# Patient Record
Sex: Female | Born: 1970 | Race: White | Hispanic: No | Marital: Married | Smoking: Former smoker
Health system: Southern US, Community
[De-identification: ages and names within clinical notes are randomized; demographics above are authoritative.]

## PROBLEM LIST (undated history)

## (undated) DIAGNOSIS — F32A Depression, unspecified: Secondary | ICD-10-CM

## (undated) DIAGNOSIS — E785 Hyperlipidemia, unspecified: Secondary | ICD-10-CM

## (undated) DIAGNOSIS — K5792 Diverticulitis of intestine, part unspecified, without perforation or abscess without bleeding: Secondary | ICD-10-CM

## (undated) DIAGNOSIS — I10 Essential (primary) hypertension: Secondary | ICD-10-CM

## (undated) DIAGNOSIS — E039 Hypothyroidism, unspecified: Secondary | ICD-10-CM

## (undated) DIAGNOSIS — E079 Disorder of thyroid, unspecified: Secondary | ICD-10-CM

## (undated) DIAGNOSIS — R87619 Unspecified abnormal cytological findings in specimens from cervix uteri: Secondary | ICD-10-CM

## (undated) DIAGNOSIS — N189 Chronic kidney disease, unspecified: Secondary | ICD-10-CM

## (undated) DIAGNOSIS — F329 Major depressive disorder, single episode, unspecified: Secondary | ICD-10-CM

## (undated) DIAGNOSIS — G932 Benign intracranial hypertension: Secondary | ICD-10-CM

## (undated) DIAGNOSIS — F419 Anxiety disorder, unspecified: Secondary | ICD-10-CM

## (undated) HISTORY — DX: Major depressive disorder, single episode, unspecified: F32.9

## (undated) HISTORY — DX: Hypothyroidism, unspecified: E03.9

## (undated) HISTORY — DX: Unspecified abnormal cytological findings in specimens from cervix uteri: R87.619

## (undated) HISTORY — DX: Hyperlipidemia, unspecified: E78.5

## (undated) HISTORY — PX: MASTOIDECTOMY: SHX711

## (undated) HISTORY — DX: Essential (primary) hypertension: I10

## (undated) HISTORY — DX: Depression, unspecified: F32.A

## (undated) HISTORY — PX: CERVIX SURGERY: SHX593

## (undated) HISTORY — DX: Diverticulitis of intestine, part unspecified, without perforation or abscess without bleeding: K57.92

## (undated) HISTORY — DX: Chronic kidney disease, unspecified: N18.9

## (undated) HISTORY — DX: Anxiety disorder, unspecified: F41.9

## (undated) HISTORY — DX: Benign intracranial hypertension: G93.2

---
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
## 1996-08-26 HISTORY — PX: TUBAL LIGATION: SHX77

## 1999-06-06 ENCOUNTER — Ambulatory Visit (HOSPITAL_COMMUNITY): Admission: RE | Admit: 1999-06-06 | Discharge: 1999-06-06 | Payer: Self-pay | Admitting: Psychiatry

## 2000-09-30 ENCOUNTER — Emergency Department (HOSPITAL_COMMUNITY): Admission: EM | Admit: 2000-09-30 | Discharge: 2000-09-30 | Payer: Self-pay | Admitting: Emergency Medicine

## 2000-09-30 ENCOUNTER — Encounter: Payer: Self-pay | Admitting: Emergency Medicine

## 2000-10-09 ENCOUNTER — Ambulatory Visit (HOSPITAL_COMMUNITY): Admission: RE | Admit: 2000-10-09 | Discharge: 2000-10-09 | Payer: Self-pay | Admitting: Neurology

## 2003-10-28 ENCOUNTER — Other Ambulatory Visit: Admission: RE | Admit: 2003-10-28 | Discharge: 2003-10-28 | Payer: Self-pay | Admitting: Obstetrics and Gynecology

## 2003-11-16 ENCOUNTER — Ambulatory Visit (HOSPITAL_COMMUNITY): Admission: RE | Admit: 2003-11-16 | Discharge: 2003-11-16 | Payer: Self-pay | Admitting: Neurology

## 2003-11-27 ENCOUNTER — Emergency Department (HOSPITAL_COMMUNITY): Admission: AD | Admit: 2003-11-27 | Discharge: 2003-11-27 | Payer: Self-pay | Admitting: Family Medicine

## 2006-02-16 ENCOUNTER — Emergency Department (HOSPITAL_COMMUNITY): Admission: EM | Admit: 2006-02-16 | Discharge: 2006-02-16 | Payer: Self-pay | Admitting: Family Medicine

## 2006-03-19 ENCOUNTER — Emergency Department (HOSPITAL_COMMUNITY): Admission: EM | Admit: 2006-03-19 | Discharge: 2006-03-19 | Payer: Self-pay | Admitting: Family Medicine

## 2006-11-16 ENCOUNTER — Emergency Department (HOSPITAL_COMMUNITY): Admission: EM | Admit: 2006-11-16 | Discharge: 2006-11-16 | Payer: Self-pay | Admitting: Emergency Medicine

## 2006-12-01 ENCOUNTER — Emergency Department (HOSPITAL_COMMUNITY): Admission: EM | Admit: 2006-12-01 | Discharge: 2006-12-01 | Payer: Self-pay | Admitting: Emergency Medicine

## 2007-02-19 ENCOUNTER — Emergency Department (HOSPITAL_COMMUNITY): Admission: EM | Admit: 2007-02-19 | Discharge: 2007-02-19 | Payer: Self-pay | Admitting: Emergency Medicine

## 2007-04-25 ENCOUNTER — Emergency Department (HOSPITAL_COMMUNITY): Admission: EM | Admit: 2007-04-25 | Discharge: 2007-04-25 | Payer: Self-pay | Admitting: Emergency Medicine

## 2007-12-03 ENCOUNTER — Ambulatory Visit (HOSPITAL_COMMUNITY): Admission: RE | Admit: 2007-12-03 | Discharge: 2007-12-03 | Payer: Self-pay | Admitting: Neurology

## 2008-02-26 ENCOUNTER — Emergency Department (HOSPITAL_BASED_OUTPATIENT_CLINIC_OR_DEPARTMENT_OTHER): Admission: EM | Admit: 2008-02-26 | Discharge: 2008-02-27 | Payer: Self-pay | Admitting: Emergency Medicine

## 2009-04-30 ENCOUNTER — Observation Stay (HOSPITAL_COMMUNITY): Admission: EM | Admit: 2009-04-30 | Discharge: 2009-05-03 | Payer: Self-pay | Admitting: Emergency Medicine

## 2009-05-02 ENCOUNTER — Encounter (INDEPENDENT_AMBULATORY_CARE_PROVIDER_SITE_OTHER): Payer: Self-pay | Admitting: *Deleted

## 2009-09-02 ENCOUNTER — Emergency Department (HOSPITAL_COMMUNITY): Admission: EM | Admit: 2009-09-02 | Discharge: 2009-09-02 | Payer: Self-pay | Admitting: Family Medicine

## 2010-07-25 ENCOUNTER — Encounter (INDEPENDENT_AMBULATORY_CARE_PROVIDER_SITE_OTHER): Payer: Self-pay | Admitting: Otolaryngology

## 2010-07-25 ENCOUNTER — Ambulatory Visit (HOSPITAL_COMMUNITY)
Admission: RE | Admit: 2010-07-25 | Discharge: 2010-07-26 | Payer: Self-pay | Source: Home / Self Care | Admitting: Otolaryngology

## 2010-11-05 LAB — GLUCOSE, CAPILLARY
Glucose-Capillary: 179 mg/dL — ABNORMAL HIGH (ref 70–99)
Glucose-Capillary: 191 mg/dL — ABNORMAL HIGH (ref 70–99)

## 2010-11-06 LAB — URINALYSIS, ROUTINE W REFLEX MICROSCOPIC
Bilirubin Urine: NEGATIVE
Glucose, UA: >1000 mg/dL — AB
Ketones, ur: NEGATIVE mg/dL
Leukocytes, UA: NEGATIVE
Nitrite: NEGATIVE
Protein, ur: NEGATIVE mg/dL
Specific Gravity, Urine: 1.02 (ref 1.005–1.030)
Urobilinogen, UA: 0.2 mg/dL (ref 0.0–1.0)
pH: 5.5 (ref 5.0–8.0)

## 2010-11-06 LAB — CBC
HCT: 38.4 % (ref 36.0–46.0)
Hemoglobin: 13 g/dL (ref 12.0–15.0)
MCH: 29.5 pg (ref 26.0–34.0)
MCHC: 33.9 g/dL (ref 30.0–36.0)
MCV: 87.1 fL (ref 78.0–100.0)
Platelets: 221 10*3/uL (ref 150–400)
RBC: 4.41 MIL/uL (ref 3.87–5.11)
RDW: 13 % (ref 11.5–15.5)
WBC: 4.1 K/uL (ref 4.0–10.5)

## 2010-11-06 LAB — GLUCOSE, CAPILLARY
Glucose-Capillary: 179 mg/dL — ABNORMAL HIGH (ref 70–99)
Glucose-Capillary: 211 mg/dL — ABNORMAL HIGH (ref 70–99)
Glucose-Capillary: 232 mg/dL — ABNORMAL HIGH (ref 70–99)
Glucose-Capillary: 249 mg/dL — ABNORMAL HIGH (ref 70–99)
Glucose-Capillary: 257 mg/dL — ABNORMAL HIGH (ref 70–99)
Glucose-Capillary: 295 mg/dL — ABNORMAL HIGH (ref 70–99)

## 2010-11-06 LAB — BASIC METABOLIC PANEL
BUN: 7 mg/dL (ref 6–23)
CO2: 24 mEq/L (ref 19–32)
Calcium: 8.5 mg/dL (ref 8.4–10.5)
Chloride: 106 mEq/L (ref 96–112)
Creatinine, Ser: 0.61 mg/dL (ref 0.4–1.2)
GFR calc Af Amer: >60 mL/min (ref >60)
Glucose, Bld: 298 mg/dL — ABNORMAL HIGH (ref 70–99)
Potassium: 4.2 mEq/L (ref 3.5–5.1)

## 2010-11-06 LAB — URINE MICROSCOPIC-ADD ON

## 2010-11-06 LAB — SURGICAL PCR SCREEN
MRSA, PCR: NEGATIVE
Staphylococcus aureus: NEGATIVE

## 2010-11-06 LAB — BASIC METABOLIC PANEL WITH GFR
GFR calc non Af Amer: >60 mL/min (ref >60)
Sodium: 135 meq/L (ref 135–145)

## 2010-11-30 LAB — GLUCOSE, CAPILLARY
Glucose-Capillary: 152 mg/dL — ABNORMAL HIGH (ref 70–99)
Glucose-Capillary: 158 mg/dL — ABNORMAL HIGH (ref 70–99)
Glucose-Capillary: 163 mg/dL — ABNORMAL HIGH (ref 70–99)
Glucose-Capillary: 167 mg/dL — ABNORMAL HIGH (ref 70–99)
Glucose-Capillary: 181 mg/dL — ABNORMAL HIGH (ref 70–99)
Glucose-Capillary: 191 mg/dL — ABNORMAL HIGH (ref 70–99)
Glucose-Capillary: 198 mg/dL — ABNORMAL HIGH (ref 70–99)
Glucose-Capillary: 207 mg/dL — ABNORMAL HIGH (ref 70–99)
Glucose-Capillary: 251 mg/dL — ABNORMAL HIGH (ref 70–99)

## 2010-11-30 LAB — URINE MICROSCOPIC-ADD ON

## 2010-11-30 LAB — BASIC METABOLIC PANEL
BUN: 9 mg/dL (ref 6–23)
CO2: 26 mEq/L (ref 19–32)
Calcium: 9.2 mg/dL (ref 8.4–10.5)
Creatinine, Ser: 0.83 mg/dL (ref 0.4–1.2)
GFR calc Af Amer: >60 mL/min (ref >60)
GFR calc Af Amer: >60 mL/min (ref >60)
GFR calc non Af Amer: >60 mL/min (ref >60)
GFR calc non Af Amer: >60 mL/min (ref >60)
GFR calc non Af Amer: >60 mL/min (ref >60)
Glucose, Bld: 196 mg/dL — ABNORMAL HIGH (ref 70–99)
Glucose, Bld: 273 mg/dL — ABNORMAL HIGH (ref 70–99)
Potassium: 3.7 mEq/L (ref 3.5–5.1)
Potassium: 4.1 mEq/L (ref 3.5–5.1)
Sodium: 136 mEq/L (ref 135–145)
Sodium: 140 mEq/L (ref 135–145)

## 2010-11-30 LAB — LIPID PANEL
Cholesterol: 245 mg/dL — ABNORMAL HIGH (ref 0–200)
LDL Cholesterol: UNDETERMINED mg/dL (ref 0–99)

## 2010-11-30 LAB — URINE CULTURE: Colony Count: 45000

## 2010-11-30 LAB — POCT CARDIAC MARKERS
CKMB, poc: <1 ng/mL — ABNORMAL LOW (ref 1.0–8.0)
Myoglobin, poc: 60 ng/mL (ref 12–200)

## 2010-11-30 LAB — URINALYSIS, ROUTINE W REFLEX MICROSCOPIC
Bilirubin Urine: NEGATIVE
Glucose, UA: >1000 mg/dL — AB
Ketones, ur: NEGATIVE mg/dL
Specific Gravity, Urine: 1.031 — ABNORMAL HIGH (ref 1.005–1.030)
pH: 5.5 (ref 5.0–8.0)

## 2010-11-30 LAB — CK TOTAL AND CKMB (NOT AT ARMC): Relative Index: INVALID (ref 0.0–2.5)

## 2010-11-30 LAB — DIFFERENTIAL
Eosinophils Relative: 2 % (ref 0–5)
Lymphocytes Relative: 34 % (ref 12–46)
Monocytes Absolute: 0.4 10*3/uL (ref 0.1–1.0)
Monocytes Relative: 8 % (ref 3–12)
Neutro Abs: 2.6 10*3/uL (ref 1.7–7.7)

## 2010-11-30 LAB — CBC
HCT: 35.8 % — ABNORMAL LOW (ref 36.0–46.0)
HCT: 35.9 % — ABNORMAL LOW (ref 36.0–46.0)
HCT: 39.8 % (ref 36.0–46.0)
Hemoglobin: 12.5 g/dL (ref 12.0–15.0)
Hemoglobin: 13.5 g/dL (ref 12.0–15.0)
MCHC: 34 g/dL (ref 30.0–36.0)
MCV: 90.6 fL (ref 78.0–100.0)
Platelets: 201 10*3/uL (ref 150–400)
Platelets: 224 10*3/uL (ref 150–400)
RBC: 3.98 MIL/uL (ref 3.87–5.11)
RBC: 4.4 MIL/uL (ref 3.87–5.11)
RDW: 12.5 % (ref 11.5–15.5)
RDW: 12.8 % (ref 11.5–15.5)
RDW: 13 % (ref 11.5–15.5)
WBC: 4.9 10*3/uL (ref 4.0–10.5)
WBC: 5.7 10*3/uL (ref 4.0–10.5)

## 2010-11-30 LAB — COMPREHENSIVE METABOLIC PANEL
AST: 44 U/L — ABNORMAL HIGH (ref 0–37)
Albumin: 3.7 g/dL (ref 3.5–5.2)
Alkaline Phosphatase: 43 U/L (ref 39–117)
BUN: 9 mg/dL (ref 6–23)
Creatinine, Ser: 0.77 mg/dL (ref 0.4–1.2)
GFR calc Af Amer: >60 mL/min (ref >60)
Potassium: 3.6 mEq/L (ref 3.5–5.1)
Total Protein: 6.9 g/dL (ref 6.0–8.3)

## 2010-11-30 LAB — CARDIAC PANEL(CRET KIN+CKTOT+MB+TROPI)
CK, MB: 0.5 ng/mL (ref 0.3–4.0)
Relative Index: INVALID (ref 0.0–2.5)
Troponin I: 0.01 ng/mL (ref 0.00–0.06)

## 2010-11-30 LAB — HEPARIN LEVEL (UNFRACTIONATED)
Heparin Unfractionated: 0.34 IU/mL (ref 0.30–0.70)
Heparin Unfractionated: 0.42 IU/mL (ref 0.30–0.70)
Heparin Unfractionated: 0.72 IU/mL — ABNORMAL HIGH (ref 0.30–0.70)
Heparin Unfractionated: 0.78 IU/mL — ABNORMAL HIGH (ref 0.30–0.70)

## 2010-11-30 LAB — TSH: TSH: 6.204 u[IU]/mL — ABNORMAL HIGH (ref 0.350–4.500)

## 2011-01-11 NOTE — Procedures (Signed)
[place]. [facility], [place]  Patient:    Natalie Kennedy, Natalie Kennedy                         MRN: [id number] Proc. Date: 10/09/00 Adm. Date:  [id number] Attending:  Fenton Malling                           Procedure Report  DATE OF BIRTH:  July 13, 1971.  PROCEDURE:  Diagnostic/therapeutic lumbar puncture.  INDICATION:  Pseudotumor cerebri.  OPERATOR:  Kelli Hope, M.D.  DESCRIPTION OF PROCEDURE:  Informed consent was signed and placed in the chart after the procedure and risks were discussed with the patient and she agreed to proceed.  The patient was placed in the right lateral decubitus position and prepped and draped in the usual sterile fashion.  Local anesthesia was achieved with 2 cc of lidocaine.  A 20-gauge spinal needle was inserted into the L3-4 interspace and advanced until clear spinal fluid was returned. Opening pressure was measured and was 390 mmH2O.  Approximately 8 cc of clear CSF were drawn off into four tubes and sent for the following studies:  (1) Cell count and differential; (2) protein and glucose; (3) Gram stain and routine culture; (4) hold.  Additional CSF was drained off to achieve a closing pressure of 140 ccH2O.  The patient did complain of a headache toward the end of the procedure.  The needle was withdrawn and hemostasis obtained. No immediate complications were noted.  The patient was advised to remain flat for an hour after the procedure and upon arriving home to remain flat for about a total of four hours. DD:  10/09/00 TD:  10/09/00 Job: [id number]

## 2011-03-20 ENCOUNTER — Emergency Department (HOSPITAL_COMMUNITY): Payer: BC Managed Care – PPO

## 2011-03-20 ENCOUNTER — Observation Stay (HOSPITAL_COMMUNITY)
Admission: EM | Admit: 2011-03-20 | Discharge: 2011-03-21 | Disposition: A | Discharge status: Home / Self Care | Payer: BC Managed Care – PPO | Attending: Interventional Cardiology | Admitting: Interventional Cardiology

## 2011-03-20 DIAGNOSIS — R0789 Other chest pain: Principal | ICD-10-CM | POA: Insufficient documentation

## 2011-03-20 DIAGNOSIS — I498 Other specified cardiac arrhythmias: Secondary | ICD-10-CM | POA: Insufficient documentation

## 2011-03-20 DIAGNOSIS — K5730 Diverticulosis of large intestine without perforation or abscess without bleeding: Secondary | ICD-10-CM | POA: Insufficient documentation

## 2011-03-20 DIAGNOSIS — Z8249 Family history of ischemic heart disease and other diseases of the circulatory system: Secondary | ICD-10-CM | POA: Insufficient documentation

## 2011-03-20 DIAGNOSIS — I10 Essential (primary) hypertension: Secondary | ICD-10-CM | POA: Insufficient documentation

## 2011-03-20 DIAGNOSIS — F341 Dysthymic disorder: Secondary | ICD-10-CM | POA: Insufficient documentation

## 2011-03-20 DIAGNOSIS — R079 Chest pain, unspecified: Secondary | ICD-10-CM

## 2011-03-20 DIAGNOSIS — E785 Hyperlipidemia, unspecified: Secondary | ICD-10-CM | POA: Insufficient documentation

## 2011-03-20 DIAGNOSIS — E039 Hypothyroidism, unspecified: Secondary | ICD-10-CM | POA: Insufficient documentation

## 2011-03-20 DIAGNOSIS — Z79899 Other long term (current) drug therapy: Secondary | ICD-10-CM | POA: Insufficient documentation

## 2011-03-20 DIAGNOSIS — E119 Type 2 diabetes mellitus without complications: Secondary | ICD-10-CM | POA: Insufficient documentation

## 2011-03-20 DIAGNOSIS — G932 Benign intracranial hypertension: Secondary | ICD-10-CM | POA: Insufficient documentation

## 2011-03-20 LAB — CK TOTAL AND CKMB (NOT AT ARMC): CK, MB: 1.2 ng/mL (ref 0.3–4.0)

## 2011-03-20 LAB — DIFFERENTIAL
Basophils Absolute: 0 10*3/uL (ref 0.0–0.1)
Eosinophils Relative: 1 % (ref 0–5)
Lymphocytes Relative: 35 % (ref 12–46)
Monocytes Absolute: 0.3 10*3/uL (ref 0.1–1.0)
Monocytes Relative: 5 % (ref 3–12)
Neutro Abs: 3 10*3/uL (ref 1.7–7.7)

## 2011-03-20 LAB — CBC
HCT: 42.5 % (ref 36.0–46.0)
Hemoglobin: 15.1 g/dL — ABNORMAL HIGH (ref 12.0–15.0)
MCH: 30.5 pg (ref 26.0–34.0)
MCHC: 35.5 g/dL (ref 30.0–36.0)
MCV: 85.9 fL (ref 78.0–100.0)
RDW: 13.6 % (ref 11.5–15.5)

## 2011-03-20 LAB — COMPREHENSIVE METABOLIC PANEL
ALT: 38 U/L — ABNORMAL HIGH (ref 0–35)
Alkaline Phosphatase: 79 U/L (ref 39–117)
BUN: 11 mg/dL (ref 6–23)
CO2: 21 mEq/L (ref 19–32)
Chloride: 101 mEq/L (ref 96–112)
GFR calc Af Amer: >60 mL/min (ref >60)
GFR calc non Af Amer: >60 mL/min (ref >60)
Glucose, Bld: 303 mg/dL — ABNORMAL HIGH (ref 70–99)
Potassium: 3.7 mEq/L (ref 3.5–5.1)
Sodium: 135 mEq/L (ref 135–145)
Total Bilirubin: 0.4 mg/dL (ref 0.3–1.2)

## 2011-03-20 LAB — D-DIMER, QUANTITATIVE: D-Dimer, Quant: 0.32 ug/mL-FEU (ref 0.00–0.48)

## 2011-03-21 LAB — GLUCOSE, CAPILLARY
Glucose-Capillary: 287 mg/dL — ABNORMAL HIGH (ref 70–99)
Glucose-Capillary: 203 mg/dL — ABNORMAL HIGH (ref 70–99)

## 2011-03-21 LAB — HEMOGLOBIN A1C: Hgb A1c MFr Bld: 7.2 % — ABNORMAL HIGH (ref <5.7)

## 2011-03-21 LAB — TSH: TSH: 6.751 u[IU]/mL — ABNORMAL HIGH (ref 0.350–4.500)

## 2011-03-21 LAB — CARDIAC PANEL(CRET KIN+CKTOT+MB+TROPI): Troponin I: <0.3 ng/mL (ref <0.30)

## 2011-04-17 NOTE — H&P (Signed)
Natalie Kennedy, Natalie Kennedy                ACCOUNT NO.:  [id number]  MEDICAL RECORD NO.:  [id number]  LOCATION:  2003                         FACILITY:  [facility]  PHYSICIAN:  Harlon Flor, MD   DATE OF BIRTH:  08-22-1971  DATE OF ADMISSION:  03/20/2011 DATE OF DISCHARGE:                             HISTORY & PHYSICAL   PRIMARY CARE PHYSICIAN:  Dr. Janeann Forehand.  CARDIOLOGIST:  Lyn Records, MD  CHIEF COMPLAINT:  Chest pain.  HISTORY OF PRESENT ILLNESS:  Natalie Kennedy is a 40 year old white female with type 2 diabetes, hypertension, and a strong family history of coronary artery disease who presents to the emergency room with a recurrent atypical chest pain.  She has had multiple stress tests in the past, most recent one within the last year that was reportedly negative. She has episodic chest discomfort, which she describes as pressure-like, symptoms radiating to the back.  It is not associated with exertion and is usually short-lived lasting only 1-2 minutes at a time.  She does not notice any symptoms such as shortness of breath or diaphoresis with the pain.  However, she does have some exertional shortness of breath that seems to have worsened over the last few months.  She has never had a left heart catheterization before.  At her last visit with Dr. Katrinka Blazing, she was told this could be considered if she continues to have pain. She is currently chest pain-free and has actually been having symptoms for 2-3 days and has negative cardiac enzymes.  PAST MEDICAL HISTORY: 1. Hypertension. 2. Diabetes mellitus type 2. 3. Depression and anxiety. 4. Diverticulosis. 5. Hyperlipidemia. 6. Hypothyroidism. 7. Pseudotumor cerebri with periodic need for lumbar puncture.  FAMILY HISTORY:  The patient's mother had coronary artery disease at an early age in her 6s.  SOCIAL HISTORY:  The patient lives at home with her husband and two children.  She has a history of smoking, but quit 5 years  ago.  She does not drink any alcohol.  HOME MEDICATIONS: 1. Saxagliptin/metformin 12/998 mg in the evening. 2. Nortriptyline 100 mg at bedtime. 3. Lexapro 10 mg daily. 4. Diltiazem 180 mg daily. 5. Levothyroxine 88 mcg daily. 6. Crestor 5 mg daily.  ALLERGIES:  No known drug allergies.  REVIEW OF SYSTEMS:  Full review of systems is obtained and is negative except as HPI.  PHYSICAL EXAMINATION:  VITAL SIGNS:  Blood pressure 144/87, heart rate 95, respirations 16, temperature 98.1. GENERAL:  No acute distress. HEENT:  Extraocular movements intact.  Oropharynx is benign.  Nonicteric sclera. NECK:  Supple. CARDIOVASCULAR:  Regular rate and rhythm without murmurs, rubs, or gallops.  No jugular venous distention. LUNGS:  Clear to auscultation bilaterally. ABDOMEN:  Soft, nontender, nondistended. EXTREMITIES:  There is no clubbing, cyanosis, or edema.  Her pulses were intact throughout. NEURO:  Grossly afocal with intact cranial nerves.  Moves all extremities well. SKIN:  No rashes. LYMPH:  No lymphadenopathy.  EKG shows sinus tachycardia with low voltage similar to previous.  No ST- T changes.  Chest x-ray is clear.  Lab data is reviewed.  Her cardiac enzymes were negative.  Her D-dimer was also negative.  Her renal  function is normal. Her hemoglobin is 15 and platelets are 187.  ASSESSMENT AND PLAN:  Natalie Kennedy is a 40 year old white female with multiple risk factors for coronary artery disease including diabetes, hypertension, and a strong family history as well as hyperlipidemia who is here with atypical chest pain and previous negative stress testing. She did have sinus tachycardia at admission that is now resolved and her D-dimer is negative.  We will bring her in overnight to rule her out for acute myocardial infarction.  She has been having pain on and off for days and has currently negative enzymes.  We will give her aspirin 325 daily and hold on any  anticoagulation.  We will otherwise continue her home medications with the exception of metformin and we will tentatively plan for a left heart catheterization in the morning.  We will give her normal saline at 100 mL per hour while she is n.p.o. after midnight tonight.     Harlon Flor, MD     MMB/MEDQ  D:  03/20/2011  T:  03/21/2011  Job:  [id number]  cc:   Lyn Records, M.D. Dr. Janeann Forehand.  Electronically Signed by Meridee Score MD on 04/17/2011 08:09:46 PM

## 2011-04-26 NOTE — Discharge Summary (Signed)
  NAMEKRIS, Natalie Kennedy                ACCOUNT NO.:  [id number]  MEDICAL RECORD NO.:  [id number]  LOCATION:  2003                         FACILITY:  [facility]  PHYSICIAN:  Lyn Records, M.D.   DATE OF BIRTH:  10-30-1970  DATE OF ADMISSION:  03/20/2011 DATE OF DISCHARGE:  03/21/2011                              DISCHARGE SUMMARY   REASON FOR ADMISSION:  Chest pain.  DISCHARGE DIAGNOSES: 1. Chest pain not felt to be cardiac in origin.  I suspect     gastrointestinal, musculoskeletal, or neurogenic.     a.     Normal coronary arteries by coronary angiography on March 21, 2011. 2. Hypertension. 3. Diabetes mellitus. 4. Hyperlipidemia. 5. Hypothyroidism. 6. Diverticulosis. 7. Depression and anxiety. 8. Pseudotumor cerebri.  PROCEDURES PERFORMED:  Diagnostic left heart catheterization via right radial approach, Dr. Verdis Prime, March 21, 2011.  DISCHARGE INSTRUCTIONS:  Medications:  Unchanged from admission and include the following: 1. Crestor 5 mg daily. 2. Diltiazem CD 180 mg daily. 3. Kombiglyze XR 12/998 mg 1 tablet each evening. 4. Levothyroxine 88 mcg per day. 5. Lexapro 10 mg per day. 6. Nortriptyline 10 mg 2 tablets at bedtime.  ACTIVITY:  Unrestricted.  Diet:  Carbohydrate modified heart-healthy.  PROGNOSIS:  Good.  FOLLOWUP:  Primary care for consideration of GI consultation to rule out acid reflux as a cause of the patient's symptomatology.  HISTORY AND PHYSICAL AND HOSPITAL COURSE:  Please see the dictated history and physical by Dr. Meridee Score.  Please see the completed coronary angiography note.  After the catheterization procedure, the patient ambulated without difficulty and was discharged.  She is felt to have normal coronary arteries, left ventricular function, and no obvious source of cardiac pain.  Further evaluation of the pain will be per primary care and Gastroenterology.     Lyn Records, M.D.     HWS/MEDQ  D:  03/21/2011  T:   03/22/2011  Job:  [id number]  Electronically Signed by Verdis Prime M.D. on 04/26/2011 04:19:47 PM

## 2011-04-26 NOTE — Cardiovascular Report (Signed)
Natalie Kennedy, Natalie Kennedy                ACCOUNT NO.:  [id number]  MEDICAL RECORD NO.:  [id number]  LOCATION:  2003                         FACILITY:  [facility]  PHYSICIAN:  Lyn Records, M.D.   DATE OF BIRTH:  08/31/1970  DATE OF PROCEDURE:  03/21/2011 DATE OF DISCHARGE:  03/21/2011                           CARDIAC CATHETERIZATION   INDICATIONS:  Recurring episodes of angina/chest pain in a patient with a strong family history of coronary artery disease.  Recent nuclear study negative for ischemia.  Two recent hospitalizations for chest pain.  PROCEDURE PERFORMED: 1. Left heart catheterization via right radial. 2. Left ventriculography. 3. Coronary angiography.  DESCRIPTION:  Right radial access using a micro puncture set after 1% Xylocaine local infiltration.  A 5-French sheath was placed without difficulty.  Four thousand units of heparin was administered and 4 mg of intra-arterial verapamil was given through the sheath.  The Versacore wire was then used within a 5-French A2 multipurpose catheter.  We were able to reach the descending aorta using this combination without difficulty.  We did appreciate the Versacore wire to allow improved stability.  We then used a multipurpose catheter to perform left ventriculography, hemodynamic recordings, and selective injections of the right coronary artery.  We also selectively engaged the left main and we were able to document that the left main was widely patent.  We then used a 3.5-cm 5-French left Judkins-shaped wire to perform left coronary angiography.  The multipurpose catheter was exchanged for the left coronary catheter over a 300-cm long tight J exchange wire.  After reviewing the digital images, the case was terminated and ultimately the sheath removed and a wristband applied with good hemostasis.  No complications occurred.  Versed 1 mg and 50 mcg of fentanyl was administered with good sedation.  RESULTS: 1. Hemodynamic  data:     a.     Aortic pressure of 142/93.     b.     Left ventricular pressure 143/11 mmHg. 2. Left ventriculography:  The left ventricular cavity size and     function are normal.  The estimated ejection fraction 60%. 3. Coronary angiography.     a.     Left main coronary artery:  Normal.     b.     Left anterior descending coronary artery:  LAD is      transapical.  Three large diagonal branches arise from the LAD.      The LAD system is normal.     c.     Circumflex artery:  Two obtuse marginal branches arise from      the vessel.  The second obtuse marginal arises distally and      bifurcates.  Circumflex system is normal.     d.     Right coronary artery:  The right coronary is a large      dominant vessel giving origin to PDA and 2 left ventricular      branches.  Two moderate-sized right ventricular branches arise      from the mid right coronary artery.  No obstruction is noted.  CONCLUSIONS: 1. Normal coronary arteries. 2. Normal left ventricular function and hemodynamics. 3. Chest pain,  noncardiac.  PLAN:  Discharge later today.  No further cardiac evaluation.  Consider GI source of pain.     Lyn Records, M.D.     HWS/MEDQ  D:  03/21/2011  T:  03/22/2011  Job:  [id number]  cc:   Merlene Laughter. Renae Gloss, M.D. Delana Meyer  Electronically Signed by Verdis Prime M.D. on 04/26/2011 04:19:43 PM

## 2011-05-21 LAB — CSF CELL COUNT WITH DIFFERENTIAL
RBC Count, CSF: 0
Tube #: 2
WBC, CSF: 1

## 2011-05-23 LAB — DIFFERENTIAL
Basophils Absolute: 0
Basophils Relative: 0
Eosinophils Absolute: 0.1
Eosinophils Relative: 1
Lymphocytes Relative: 24
Lymphs Abs: 1.8
Monocytes Absolute: 0.5
Monocytes Relative: 7
Neutro Abs: 5
Neutrophils Relative %: 67

## 2011-05-23 LAB — URINALYSIS, ROUTINE W REFLEX MICROSCOPIC
Bilirubin Urine: NEGATIVE
Glucose, UA: NEGATIVE
Hgb urine dipstick: NEGATIVE
Ketones, ur: NEGATIVE
Nitrite: NEGATIVE
Protein, ur: NEGATIVE
Specific Gravity, Urine: 1.014
Urobilinogen, UA: 1
pH: 7.5

## 2011-05-23 LAB — URINE CULTURE: Colony Count: 25000

## 2011-05-23 LAB — URINE MICROSCOPIC-ADD ON

## 2011-05-23 LAB — COMPREHENSIVE METABOLIC PANEL WITH GFR
ALT: 19
Albumin: 4.5
Alkaline Phosphatase: 71
BUN: 10
Calcium: 9.3
Potassium: 3.6
Sodium: 140
Total Protein: 7.5

## 2011-05-23 LAB — CBC
HCT: 36.2
Hemoglobin: 12.7
MCHC: 35.1
MCV: 87.4
Platelets: 255
RBC: 4.14
RDW: 12.8
WBC: 7.4

## 2011-05-23 LAB — PREGNANCY, URINE: Preg Test, Ur: NEGATIVE

## 2011-05-23 LAB — LIPASE, BLOOD: Lipase: 129

## 2011-05-23 LAB — COMPREHENSIVE METABOLIC PANEL
AST: 21
CO2: 22
Chloride: 107
Creatinine, Ser: 0.9
GFR calc Af Amer: >60
GFR calc non Af Amer: >60
Glucose, Bld: 126 — ABNORMAL HIGH
Total Bilirubin: 0.7

## 2011-06-12 LAB — URINE MICROSCOPIC-ADD ON

## 2011-06-12 LAB — URINALYSIS, ROUTINE W REFLEX MICROSCOPIC
Bilirubin Urine: NEGATIVE
Nitrite: NEGATIVE
Specific Gravity, Urine: 1.019
pH: 7

## 2011-06-12 LAB — COMPREHENSIVE METABOLIC PANEL
AST: 20
CO2: 25
Calcium: 9
Creatinine, Ser: 0.7
GFR calc Af Amer: >60
GFR calc non Af Amer: >60

## 2011-06-12 LAB — POCT URINALYSIS DIP (DEVICE)
Nitrite: NEGATIVE
Protein, ur: NEGATIVE
Urobilinogen, UA: 4 — ABNORMAL HIGH
pH: 7

## 2011-06-12 LAB — CBC
MCHC: 34.5
MCV: 86.8
Platelets: 276
RBC: 4.26

## 2011-06-12 LAB — DIFFERENTIAL
Eosinophils Relative: 3
Lymphocytes Relative: 24
Lymphs Abs: 2.1

## 2011-06-12 LAB — LIPASE, BLOOD: Lipase: 20

## 2011-07-25 ENCOUNTER — Encounter: Payer: Self-pay | Admitting: Emergency Medicine

## 2011-07-25 ENCOUNTER — Emergency Department (HOSPITAL_COMMUNITY)
Admission: EM | Admit: 2011-07-25 | Discharge: 2011-07-25 | Disposition: A | Discharge status: Home / Self Care | Payer: BC Managed Care – PPO | Source: Home / Self Care | Attending: Family Medicine | Admitting: Family Medicine

## 2011-07-25 DIAGNOSIS — J069 Acute upper respiratory infection, unspecified: Secondary | ICD-10-CM

## 2011-07-25 HISTORY — DX: Disorder of thyroid, unspecified: E07.9

## 2011-07-25 NOTE — ED Notes (Signed)
Pt here with sore throat that started last week then eased up now pain restarted yesterday .denies diff swallowing or fevers.

## 2011-07-25 NOTE — ED Provider Notes (Signed)
History     CSN: [id number] Arrival date & time: 07/25/2011  9:11 AM   First MD Initiated Contact with Patient 07/25/11 1004      Chief Complaint  Patient presents with  . Sore Throat    (Consider location/radiation/quality/duration/timing/severity/associated sxs/prior treatment) HPI Comments: Natalie Kennedy presents for evaluation of sore throat, cough, head congestion. She reports a sick contact at work with strep throat. She denies any fever and reports no hx of allergies.  Patient is a 40 y.o. female presenting with pharyngitis. The history is provided by the patient.  Sore Throat This is a new problem. The current episode started more than 2 days ago. The problem occurs constantly. The problem has been gradually improving. Pertinent negatives include no shortness of breath. The symptoms are aggravated by nothing. The symptoms are relieved by nothing. She has tried nothing for the symptoms. The treatment provided no relief.    Past Medical History  Diagnosis Date  . Diabetes mellitus   . Thyroid disease     Past Surgical History  Procedure Date  . Tubal ligation     Family History  Problem Relation Age of Onset  . Diabetes Other     History  Substance Use Topics  . Smoking status: Never Smoker   . Smokeless tobacco: Not on file  . Alcohol Use: Yes     ocassionally    OB History    Grav Para Term Preterm Abortions TAB SAB Ect Mult Living                  Review of Systems  Constitutional: Negative for fever and chills.  HENT: Positive for congestion, sore throat and rhinorrhea. Negative for ear pain, sneezing and trouble swallowing.   Eyes: Negative.   Respiratory: Positive for cough. Negative for shortness of breath and wheezing.   Cardiovascular: Negative.   Gastrointestinal: Negative.   Genitourinary: Negative.   Musculoskeletal: Negative.   Skin: Negative.   Neurological: Negative.     Allergies  Review of patient's allergies indicates no known  allergies.  Home Medications   Current Outpatient Rx  Name Route Sig Dispense Refill  . ESCITALOPRAM OXALATE 10 MG PO TABS Oral Take 10 mg by mouth daily.      Marland Kitchen LEVOTHYROXINE SODIUM 88 MCG PO TABS Oral Take 88 mcg by mouth daily.      Marland Kitchen METFORMIN HCL 500 MG PO TABS Oral Take 500 mg by mouth 2 (two) times daily with a meal.        BP 121/89  Pulse 96  Temp(Src) 98.2 F (36.8 C) (Oral)  Resp 18  SpO2 98%  LMP 07/25/2011  Physical Exam  Nursing note and vitals reviewed. Constitutional: She is oriented to person, place, and time. She appears well-developed and well-nourished.  HENT:  Head: Normocephalic and atraumatic.  Right Ear: Tympanic membrane and external ear normal.  Left Ear: Tympanic membrane and external ear normal.  Nose: Nose normal.  Mouth/Throat: Uvula is midline, oropharynx is clear and moist and mucous membranes are normal. No oropharyngeal exudate or tonsillar abscesses.  Eyes: EOM are normal. Pupils are equal, round, and reactive to light.  Neck: Normal range of motion.  Cardiovascular: Normal rate and regular rhythm.   Pulmonary/Chest: Effort normal and breath sounds normal.  Neurological: She is alert and oriented to person, place, and time.  Skin: Skin is warm and dry.    ED Course  Procedures (including critical care time)   Labs Reviewed  POCT RAPID STREP  A ([facility] ONLY)   No results found.   No diagnosis found.    MDM  Rapid strep: negative        Richardo Priest, MD 07/25/11 1046

## 2012-08-31 ENCOUNTER — Emergency Department (HOSPITAL_BASED_OUTPATIENT_CLINIC_OR_DEPARTMENT_OTHER)
Admission: EM | Admit: 2012-08-31 | Discharge: 2012-08-31 | Disposition: A | Discharge status: Home / Self Care | Payer: BC Managed Care – PPO | Attending: Emergency Medicine | Admitting: Emergency Medicine

## 2012-08-31 ENCOUNTER — Encounter (HOSPITAL_BASED_OUTPATIENT_CLINIC_OR_DEPARTMENT_OTHER): Payer: Self-pay

## 2012-08-31 ENCOUNTER — Emergency Department (HOSPITAL_BASED_OUTPATIENT_CLINIC_OR_DEPARTMENT_OTHER): Payer: BC Managed Care – PPO

## 2012-08-31 DIAGNOSIS — Z79899 Other long term (current) drug therapy: Secondary | ICD-10-CM | POA: Insufficient documentation

## 2012-08-31 DIAGNOSIS — E079 Disorder of thyroid, unspecified: Secondary | ICD-10-CM | POA: Insufficient documentation

## 2012-08-31 DIAGNOSIS — Z9851 Tubal ligation status: Secondary | ICD-10-CM | POA: Insufficient documentation

## 2012-08-31 DIAGNOSIS — Z9861 Coronary angioplasty status: Secondary | ICD-10-CM | POA: Insufficient documentation

## 2012-08-31 DIAGNOSIS — E119 Type 2 diabetes mellitus without complications: Secondary | ICD-10-CM | POA: Insufficient documentation

## 2012-08-31 DIAGNOSIS — R0789 Other chest pain: Secondary | ICD-10-CM | POA: Insufficient documentation

## 2012-08-31 DIAGNOSIS — R079 Chest pain, unspecified: Secondary | ICD-10-CM

## 2012-08-31 LAB — CBC WITH DIFFERENTIAL/PLATELET
Basophils Relative: 0 % (ref 0–1)
Eosinophils Absolute: 0.1 10*3/uL (ref 0.0–0.7)
HCT: 38.5 % (ref 36.0–46.0)
Hemoglobin: 13.4 g/dL (ref 12.0–15.0)
MCH: 29.8 pg (ref 26.0–34.0)
MCHC: 34.8 g/dL (ref 30.0–36.0)
MCV: 85.6 fL (ref 78.0–100.0)
Monocytes Absolute: 0.4 10*3/uL (ref 0.1–1.0)
Monocytes Relative: 7 % (ref 3–12)

## 2012-08-31 LAB — COMPREHENSIVE METABOLIC PANEL
Albumin: 3.9 g/dL (ref 3.5–5.2)
BUN: 9 mg/dL (ref 6–23)
Creatinine, Ser: 0.5 mg/dL (ref 0.50–1.10)
GFR calc Af Amer: >90 mL/min (ref >90)
Glucose, Bld: 232 mg/dL — ABNORMAL HIGH (ref 70–99)
Total Bilirubin: 0.5 mg/dL (ref 0.3–1.2)
Total Protein: 7.1 g/dL (ref 6.0–8.3)

## 2012-08-31 LAB — TROPONIN I: Troponin I: <0.3 ng/mL (ref <0.30)

## 2012-08-31 MED ORDER — HYDROCODONE-ACETAMINOPHEN 5-325 MG PO TABS
2.0000 | ORAL_TABLET | Freq: Once | ORAL | Status: AC
  Administered 2012-08-31: 2 via ORAL
  Filled 2012-08-31: qty 2

## 2012-08-31 MED ORDER — HYDROCODONE-ACETAMINOPHEN 5-325 MG PO TABS: 2.0000 | ORAL_TABLET | ORAL | Status: DC | PRN

## 2012-08-31 NOTE — ED Notes (Signed)
Pt reports onset of left chest wall pain that started last night.  Pain radiates to left arm and is described pressure.

## 2012-08-31 NOTE — ED Notes (Signed)
Patient C/o onset of left upper chest pain last PM as well as a headache with sharp shooting pain..  States that pain was worse when she woke up this AM.  C/O pain in left upper chest with left arm heaviness. The headache persists, pain is still sharp but not shooting.  Left upper chest is tender to palpation but unaffected by ROM

## 2012-08-31 NOTE — ED Provider Notes (Signed)
History     CSN: [id number]  Arrival date & time 08/31/12  1146   First MD Initiated Contact with Patient 08/31/12 1220      Chief Complaint  Patient presents with  . Chest Pain    (Consider location/radiation/quality/duration/timing/severity/associated sxs/prior treatment) Patient is a 42 y.o. female presenting with chest pain. The history is provided by the patient. No language interpreter was used.  Chest Pain The chest pain began yesterday. Chest pain occurs constantly. The chest pain is worsening. At its most intense, the pain is at 7/10. The quality of the pain is described as aching. The pain radiates to the left shoulder. Chest pain is worsened by certain positions. Pertinent negatives for primary symptoms include no fatigue, no shortness of breath, no cough, no nausea and no vomiting. She tried nothing for the symptoms. Risk factors: dibetes.  Her past medical history is significant for diabetes.  Pertinent negatives for past medical history include no CAD.  Her family medical history is significant for CAD in family and diabetes in family.  Procedure history is positive for cardiac catheterization. Procedure history comments: cath 18 months ago  normal.     Past Medical History  Diagnosis Date  . Diabetes mellitus   . Thyroid disease     Past Surgical History  Procedure Date  . Tubal ligation     Family History  Problem Relation Age of Onset  . Diabetes Other     History  Substance Use Topics  . Smoking status: Never Smoker   . Smokeless tobacco: Not on file  . Alcohol Use: Yes     Comment: ocassionally    OB History    Grav Para Term Preterm Abortions TAB SAB Ect Mult Living                  Review of Systems  Constitutional: Negative for fatigue.  Respiratory: Negative for cough and shortness of breath.   Cardiovascular: Positive for chest pain.  Gastrointestinal: Negative for nausea and vomiting.  All other systems reviewed and are  negative.    Allergies  Review of patient's allergies indicates no known allergies.  Home Medications   Current Outpatient Rx  Name  Route  Sig  Dispense  Refill  . ESCITALOPRAM OXALATE 10 MG PO TABS   Oral   Take 10 mg by mouth daily.           Marland Kitchen LEVOTHYROXINE SODIUM 88 MCG PO TABS   Oral   Take 88 mcg by mouth daily.           Marland Kitchen METFORMIN HCL 500 MG PO TABS   Oral   Take 500 mg by mouth 2 (two) times daily with a meal.             BP 123/86  Pulse 96  Temp 98.7 F (37.1 C) (Oral)  Resp 20  Ht 5\' 3"  (1.6 m)  Wt 196 lb (88.905 kg)  BMI 34.72 kg/m2  SpO2 97%  LMP 08/31/2012  Physical Exam  Nursing note and vitals reviewed. Constitutional: She is oriented to person, place, and time. She appears well-developed and well-nourished.  HENT:  Head: Normocephalic and atraumatic.  Right Ear: External ear normal.  Left Ear: External ear normal.  Nose: Nose normal.  Mouth/Throat: Oropharynx is clear and moist.  Eyes: Conjunctivae normal and EOM are normal. Pupils are equal, round, and reactive to light.  Neck: Normal range of motion. Neck supple.  Cardiovascular: Normal rate, regular rhythm and  normal heart sounds.   Pulmonary/Chest: Effort normal and breath sounds normal.  Abdominal: Soft. Bowel sounds are normal.  Musculoskeletal: Normal range of motion.  Neurological: She is alert and oriented to person, place, and time. She has normal reflexes.  Skin: Skin is warm.  Psychiatric: She has a normal mood and affect.    ED Course  Procedures (including critical care time)   Labs Reviewed  TROPONIN I  CBC WITH DIFFERENTIAL  COMPREHENSIVE METABOLIC PANEL   Dg Chest 2 View  08/31/2012  *RADIOLOGY REPORT*  Clinical Data: Chest pain.  CHEST - 2 VIEW  Comparison: Chest x-rays 03/20/2011.  Findings: Lung volumes are normal.  No consolidative airspace disease.  No pleural effusions.  No pneumothorax.  No pulmonary nodule or mass noted.  Pulmonary vasculature and the  cardiomediastinal silhouette are within normal limits.  IMPRESSION: 1. No radiographic evidence of acute cardiopulmonary disease.   Original Report Authenticated By: Trudie Reed, M.D.      1. Chest pain       MDM   Date: 08/31/2012  Rate: 87  Rhythm: normal sinus rhythm  QRS Axis: normal  Intervals: normal   ST/T Wave abnormalities: normal  Conduction Disutrbances:none  Narrative Interpretation:   Old EKG Reviewed: unchanged     Troponin negative.   i reviewed cath report no coronary artery disease.   Dr. Fonnie Jarvis in to see and evaluate.   Pt advised to follow up with her primary for recheck   Elson Areas, [place] 09/01/12 1448  Lonia Skinner Grady, [place] 09/01/12 [phone]

## 2012-09-01 NOTE — ED Provider Notes (Signed)
Medical screening examination/treatment/procedure(s) were conducted as a shared visit with non-physician practitioner(s) and myself.  I personally evaluated the patient during the encounter.  Atypical CP constant over 6 hours, feel ACS unlikely.  Hurman Horn, MD 09/01/12 [phone]

## 2014-08-04 ENCOUNTER — Emergency Department (HOSPITAL_COMMUNITY)
Admission: EM | Admit: 2014-08-04 | Discharge: 2014-08-04 | Disposition: A | Discharge status: Home / Self Care | Payer: BC Managed Care – PPO | Attending: Emergency Medicine | Admitting: Emergency Medicine

## 2014-08-04 ENCOUNTER — Encounter (HOSPITAL_COMMUNITY): Payer: Self-pay | Admitting: Vascular Surgery

## 2014-08-04 ENCOUNTER — Emergency Department (HOSPITAL_COMMUNITY): Payer: BC Managed Care – PPO

## 2014-08-04 DIAGNOSIS — Z79899 Other long term (current) drug therapy: Secondary | ICD-10-CM | POA: Insufficient documentation

## 2014-08-04 DIAGNOSIS — R0602 Shortness of breath: Secondary | ICD-10-CM

## 2014-08-04 DIAGNOSIS — E119 Type 2 diabetes mellitus without complications: Secondary | ICD-10-CM | POA: Diagnosis not present

## 2014-08-04 DIAGNOSIS — R143 Flatulence: Secondary | ICD-10-CM | POA: Insufficient documentation

## 2014-08-04 DIAGNOSIS — I129 Hypertensive chronic kidney disease with stage 1 through stage 4 chronic kidney disease, or unspecified chronic kidney disease: Secondary | ICD-10-CM | POA: Insufficient documentation

## 2014-08-04 DIAGNOSIS — N181 Chronic kidney disease, stage 1: Secondary | ICD-10-CM | POA: Diagnosis not present

## 2014-08-04 DIAGNOSIS — F329 Major depressive disorder, single episode, unspecified: Secondary | ICD-10-CM | POA: Insufficient documentation

## 2014-08-04 DIAGNOSIS — R0789 Other chest pain: Secondary | ICD-10-CM | POA: Insufficient documentation

## 2014-08-04 DIAGNOSIS — F419 Anxiety disorder, unspecified: Secondary | ICD-10-CM | POA: Diagnosis not present

## 2014-08-04 DIAGNOSIS — E039 Hypothyroidism, unspecified: Secondary | ICD-10-CM | POA: Insufficient documentation

## 2014-08-04 DIAGNOSIS — E785 Hyperlipidemia, unspecified: Secondary | ICD-10-CM | POA: Insufficient documentation

## 2014-08-04 DIAGNOSIS — R079 Chest pain, unspecified: Secondary | ICD-10-CM | POA: Diagnosis present

## 2014-08-04 DIAGNOSIS — Z8669 Personal history of other diseases of the nervous system and sense organs: Secondary | ICD-10-CM | POA: Insufficient documentation

## 2014-08-04 DIAGNOSIS — Z794 Long term (current) use of insulin: Secondary | ICD-10-CM | POA: Diagnosis not present

## 2014-08-04 DIAGNOSIS — Z8719 Personal history of other diseases of the digestive system: Secondary | ICD-10-CM | POA: Insufficient documentation

## 2014-08-04 DIAGNOSIS — IMO0001 Reserved for inherently not codable concepts without codable children: Secondary | ICD-10-CM

## 2014-08-04 LAB — BASIC METABOLIC PANEL
ANION GAP: 11 (ref 5–15)
BUN: 6 mg/dL (ref 6–23)
CHLORIDE: 102 meq/L (ref 96–112)
CO2: 26 meq/L (ref 19–32)
CREATININE: 0.59 mg/dL (ref 0.50–1.10)
Calcium: 9.4 mg/dL (ref 8.4–10.5)
GFR calc Af Amer: >90 mL/min (ref >90)
GFR calc non Af Amer: >90 mL/min (ref >90)
GLUCOSE: 107 mg/dL — AB (ref 70–99)
Potassium: 4.1 mEq/L (ref 3.7–5.3)
Sodium: 139 mEq/L (ref 137–147)

## 2014-08-04 LAB — I-STAT TROPONIN, ED
Troponin i, poc: 0 ng/mL (ref 0.00–0.08)
Troponin i, poc: 0.01 ng/mL (ref 0.00–0.08)

## 2014-08-04 LAB — CBC
HEMATOCRIT: 37.4 % (ref 36.0–46.0)
HEMOGLOBIN: 12.5 g/dL (ref 12.0–15.0)
MCH: 29.1 pg (ref 26.0–34.0)
MCHC: 33.4 g/dL (ref 30.0–36.0)
MCV: 87.2 fL (ref 78.0–100.0)
Platelets: 269 10*3/uL (ref 150–400)
RBC: 4.29 MIL/uL (ref 3.87–5.11)
RDW: 13.6 % (ref 11.5–15.5)
WBC: 6.9 10*3/uL (ref 4.0–10.5)

## 2014-08-04 MED ORDER — PANTOPRAZOLE SODIUM 20 MG PO TBEC: 20.0000 mg | DELAYED_RELEASE_TABLET | Freq: Every day | ORAL | Status: DC

## 2014-08-04 MED ORDER — ASPIRIN 81 MG PO CHEW
324.0000 mg | CHEWABLE_TABLET | Freq: Once | ORAL | Status: AC
  Administered 2014-08-04: 324 mg via ORAL
  Filled 2014-08-04: qty 4

## 2014-08-04 MED ORDER — ONDANSETRON HCL 4 MG/2ML IJ SOLN
4.0000 mg | Freq: Once | INTRAMUSCULAR | Status: AC
  Administered 2014-08-04: 4 mg via INTRAVENOUS
  Filled 2014-08-04: qty 2

## 2014-08-04 MED ORDER — SODIUM CHLORIDE 0.9 % IV BOLUS (SEPSIS)
1000.0000 mL | Freq: Once | INTRAVENOUS | Status: AC
  Administered 2014-08-04: 1000 mL via INTRAVENOUS

## 2014-08-04 MED ORDER — MORPHINE SULFATE 4 MG/ML IJ SOLN
4.0000 mg | Freq: Once | INTRAMUSCULAR | Status: AC
  Administered 2014-08-04: 4 mg via INTRAVENOUS
  Filled 2014-08-04: qty 1

## 2014-08-04 MED ORDER — PANTOPRAZOLE SODIUM 40 MG PO TBEC
40.0000 mg | DELAYED_RELEASE_TABLET | Freq: Once | ORAL | Status: AC
  Administered 2014-08-04: 40 mg via ORAL
  Filled 2014-08-04: qty 1

## 2014-08-04 NOTE — ED Provider Notes (Signed)
CSN: [id number]     Arrival date & time 08/04/14  1808 History   First MD Initiated Contact with Patient 08/04/14 1914     Chief Complaint  Patient presents with  . Chest Pain     (Consider location/radiation/quality/duration/timing/severity/associated sxs/prior Treatment) HPI   Patient to the ED with complaints of chest "heaviness" that started at 9 am this morning while she was working at rest. She had two cups of coffee and V8 for breakfast and admits she hasn't eaten much else today. Throughout the day her symptoms have been constant not made worse or better by anything like movement, coughing, OTC medications. She deneis having burping, gas, acidic taste  In her mouth. She has associated left arm pain that is intermittent. She has also had associated SOB that has since resolved. The patient had a normal cardiac cath in 2012. She has diabetes, thyroid disease,  Hypertension, hyperlipidemia, depression, anxiety, pseudotumor cerebri, diverticulitis.  Past Medical History  Diagnosis Date  . Diabetes mellitus   . Thyroid disease   . Hypertension     [facility]  . Hypothyroidism   . Hyperlipidemia   . Depression   . Anxiety   . Diverticulitis   . Pseudotumor cerebri     diagnosed with pseudotumor cerebri and treated with lumbar punctures but evaluation by Dr Jannifer Franklin in 2012 indicated migraine headaches  . CKD (chronic kidney disease)     1  . Abnormal Pap smear of cervix    Past Surgical History  Procedure Laterality Date  . Tubal ligation  1998  . Mastoidectomy  20011 and 1982    mastoid surgery   . Tonsillectomy  1979  . Cervix surgery      surgery on cervix for abnormal pap 2009   Family History  Problem Relation Age of Onset  . Diabetes Other   . Diabetes Mother   . Hypertension Mother   . Heart attack Mother     MI (61)  . Diabetes Maternal Grandfather   . Lung cancer Maternal Grandfather   . Heart attack Paternal Grandmother     MI (35)   History  Substance Use  Topics  . Smoking status: Never Smoker   . Smokeless tobacco: Not on file  . Alcohol Use: Yes     Comment: ocassionally   OB History    No data available     Review of Systems  10 Systems reviewed and are negative for acute change except as noted in the HPI.    Allergies  Lisinopril  Home Medications   Prior to Admission medications   Medication Sig Start Date End Date Taking? Authorizing Provider  atorvastatin (LIPITOR) 20 MG tablet Take 20 mg by mouth daily.   Yes Historical Provider, MD  escitalopram (LEXAPRO) 10 MG tablet Take 10 mg by mouth daily as needed (mood stabilizer).    Yes Historical Provider, MD  insulin detemir (LEVEMIR) 100 UNIT/ML injection Inject 62 Units into the skin at bedtime.   Yes Historical Provider, MD  levothyroxine (SYNTHROID, LEVOTHROID) 88 MCG tablet Take 88 mcg by mouth daily.     Yes Historical Provider, MD  losartan (COZAAR) 25 MG tablet Take 25 mg by mouth daily.   Yes Historical Provider, MD  Melatonin 3 MG CAPS Take 1 capsule by mouth at bedtime.   Yes Historical Provider, MD  Saxagliptin-Metformin (KOMBIGLYZE XR) 12-998 MG TB24 Take 1 tablet by mouth daily.   Yes Historical Provider, MD  HYDROcodone-acetaminophen (NORCO/VICODIN) 5-325 MG per tablet Take  2 tablets by mouth every 4 (four) hours as needed for pain. Patient not taking: Reported on 08/04/2014 08/31/12   Fransico Meadow, PA-C   BP 127/80 mmHg  Pulse 88  Temp(Src) 98.2 F (36.8 C)  Resp 18  SpO2 100% Physical Exam  Constitutional: She appears well-developed and well-nourished. No distress.  HENT:  Head: Normocephalic and atraumatic.  Eyes: Pupils are equal, round, and reactive to light.  Neck: Normal range of motion. Neck supple.  Cardiovascular: Normal rate and regular rhythm.   Pulmonary/Chest: Effort normal and breath sounds normal. She has no decreased breath sounds. She has no wheezes. She has no rhonchi.  Abdominal: Soft.  Neurological: She is alert.  Skin: Skin is  warm and dry.  Nursing note and vitals reviewed.   ED Course  Procedures (including critical care time) Labs Review Labs Reviewed  BASIC METABOLIC PANEL - Abnormal; Notable for the following:    Glucose, Bld 107 (*)    All other components within normal limits  CBC  I-STAT TROPOININ, ED    Imaging Review No results found.   EKG Interpretation None      MDM   Final diagnoses:  SOB (shortness of breath)    CONCLUSIONS: 1. Normal coronary arteries. 2. Normal left ventricular function and hemodynamics. 3. Chest pain, noncardiac.  PLAN: Discharge later today. No further cardiac evaluation. Consider GI source of pain.  Belva Crome, M.D. - HWS/MEDQ D: 03/21/2011 T: 03/22/2011 Job: [id number]   Patients pain has been ongoing for 12 hours and she has a normal EKG and two negative Troponins here. She will need follow-up with cardiology. Will arrange this for her as outpatient. Symptoms treated and improved in the ED. Pt reports the Protonix broke up her gas, her pressure has now moved to her lower abdomen and she is passing gas and obtaining relief.  The patient has been seen by  Pamella Pert, MD. My supervising physician agrees with the work-up and diagnosis. The EDPhysician feels comfortable with the patients disposition and treatment plan.  43 y.o.Javeria M Porto's evaluation in the Emergency Department is complete. It has been determined that no acute conditions requiring further emergency intervention are present at this time. The patient/guardian have been advised of the diagnosis and plan. We have discussed signs and symptoms that warrant return to the ED, such as changes or worsening in symptoms.  Vital signs are stable at discharge. Filed Vitals:   08/04/14 2100  BP: 121/89  Pulse: 78  Temp:   Resp: 25    Patient/guardian has voiced understanding and agreed to follow-up with the PCP or specialist.   Linus Mako, PA-C 08/04/14 2122  Linus Mako, PA-C 08/04/14 2128  Pamella Pert, MD 08/05/14 0454

## 2014-08-04 NOTE — Discharge Instructions (Signed)
Bloating Bloating is the feeling of fullness in your belly. You may feel as though your pants are too tight. Often the cause of bloating is overeating, retaining fluids, or having gas in your bowel. It is also caused by swallowing air and eating foods that cause gas. Irritable bowel syndrome is one of the most common causes of bloating. Constipation is also a common cause. Sometimes more serious problems can cause bloating. SYMPTOMS  Usually there is a feeling of fullness, as though your abdomen is bulged out. There may be mild discomfort.  DIAGNOSIS  Usually no particular testing is necessary for most bloating. If the condition persists and seems to become worse, your caregiver may do additional testing.  TREATMENT   There is no direct treatment for bloating.  Do not put gas into the bowel. Avoid chewing gum and sucking on candy. These tend to make you swallow air. Swallowing air can also be a nervous habit. Try to avoid this.  Avoiding high residue diets will help. Eat foods with soluble fibers (examples include root vegetables, apples, or barley) and substitute dairy products with soy and rice products. This helps irritable bowel syndrome.  If constipation is the cause, then a high residue diet with more fiber will help.  Avoid carbonated beverages.  Over-the-counter preparations are available that help reduce gas. Your pharmacist can help you with this. SEEK MEDICAL CARE IF:   Bloating continues and seems to be getting worse.  You notice a weight gain.  You have a weight loss but the bloating is getting worse.  You have changes in your bowel habits or develop nausea or vomiting. SEEK IMMEDIATE MEDICAL CARE IF:   You develop shortness of breath or swelling in your legs.  You have an increase in abdominal pain or develop chest pain. Document Released: 06/12/2006 Document Revised: 11/04/2011 Document Reviewed: 07/31/2007 Exit[employer] Patient Information 2015 [employer]. This  information is not intended to replace advice given to you by your health care provider. Make sure you discuss any questions you have with your health care provider.  

## 2014-08-04 NOTE — ED Notes (Signed)
Pt reports to the ED for eval of SOB, left sided chest heaviness, and dizziness. Pt reports she felt fine this am and throughout the day she has become increasingly SOB and developed chest heaviness. She also reports left arm pain. Movement does not affect the pain. Pt A&Ox4, resp e/u, and skin warm and dry. 12 lead obtained in triage.

## 2014-10-15 ENCOUNTER — Emergency Department (HOSPITAL_BASED_OUTPATIENT_CLINIC_OR_DEPARTMENT_OTHER)
Admission: EM | Admit: 2014-10-15 | Discharge: 2014-10-16 | Disposition: A | Discharge status: Home / Self Care | Payer: BLUE CROSS/BLUE SHIELD | Attending: Emergency Medicine | Admitting: Emergency Medicine

## 2014-10-15 ENCOUNTER — Emergency Department (HOSPITAL_BASED_OUTPATIENT_CLINIC_OR_DEPARTMENT_OTHER): Payer: BLUE CROSS/BLUE SHIELD

## 2014-10-15 ENCOUNTER — Encounter (HOSPITAL_BASED_OUTPATIENT_CLINIC_OR_DEPARTMENT_OTHER): Payer: Self-pay | Admitting: *Deleted

## 2014-10-15 DIAGNOSIS — K5732 Diverticulitis of large intestine without perforation or abscess without bleeding: Secondary | ICD-10-CM | POA: Diagnosis not present

## 2014-10-15 DIAGNOSIS — N181 Chronic kidney disease, stage 1: Secondary | ICD-10-CM | POA: Diagnosis not present

## 2014-10-15 DIAGNOSIS — R109 Unspecified abdominal pain: Secondary | ICD-10-CM

## 2014-10-15 DIAGNOSIS — Z794 Long term (current) use of insulin: Secondary | ICD-10-CM | POA: Diagnosis not present

## 2014-10-15 DIAGNOSIS — Z3202 Encounter for pregnancy test, result negative: Secondary | ICD-10-CM | POA: Insufficient documentation

## 2014-10-15 DIAGNOSIS — Z8669 Personal history of other diseases of the nervous system and sense organs: Secondary | ICD-10-CM | POA: Insufficient documentation

## 2014-10-15 DIAGNOSIS — E669 Obesity, unspecified: Secondary | ICD-10-CM | POA: Diagnosis not present

## 2014-10-15 DIAGNOSIS — F329 Major depressive disorder, single episode, unspecified: Secondary | ICD-10-CM | POA: Insufficient documentation

## 2014-10-15 DIAGNOSIS — I129 Hypertensive chronic kidney disease with stage 1 through stage 4 chronic kidney disease, or unspecified chronic kidney disease: Secondary | ICD-10-CM | POA: Diagnosis not present

## 2014-10-15 DIAGNOSIS — E785 Hyperlipidemia, unspecified: Secondary | ICD-10-CM | POA: Insufficient documentation

## 2014-10-15 DIAGNOSIS — E039 Hypothyroidism, unspecified: Secondary | ICD-10-CM | POA: Insufficient documentation

## 2014-10-15 DIAGNOSIS — R1032 Left lower quadrant pain: Secondary | ICD-10-CM | POA: Diagnosis present

## 2014-10-15 DIAGNOSIS — Z9851 Tubal ligation status: Secondary | ICD-10-CM | POA: Insufficient documentation

## 2014-10-15 DIAGNOSIS — R1084 Generalized abdominal pain: Secondary | ICD-10-CM

## 2014-10-15 DIAGNOSIS — Z79899 Other long term (current) drug therapy: Secondary | ICD-10-CM | POA: Diagnosis not present

## 2014-10-15 DIAGNOSIS — F419 Anxiety disorder, unspecified: Secondary | ICD-10-CM | POA: Diagnosis not present

## 2014-10-15 LAB — URINALYSIS, ROUTINE W REFLEX MICROSCOPIC
Bilirubin Urine: NEGATIVE
Glucose, UA: NEGATIVE mg/dL
Hgb urine dipstick: NEGATIVE
Ketones, ur: 40 mg/dL — AB
Leukocytes, UA: NEGATIVE
Nitrite: NEGATIVE
PROTEIN: NEGATIVE mg/dL
Specific Gravity, Urine: 1.023 (ref 1.005–1.030)
UROBILINOGEN UA: 2 mg/dL — AB (ref 0.0–1.0)
pH: 7.5 (ref 5.0–8.0)

## 2014-10-15 LAB — CBC WITH DIFFERENTIAL/PLATELET
BASOS ABS: 0 10*3/uL (ref 0.0–0.1)
Basophils Relative: 0 % (ref 0–1)
Eosinophils Absolute: 0.1 10*3/uL (ref 0.0–0.7)
Eosinophils Relative: 1 % (ref 0–5)
HEMATOCRIT: 39.8 % (ref 36.0–46.0)
HEMOGLOBIN: 13.2 g/dL (ref 12.0–15.0)
LYMPHS ABS: 1.5 10*3/uL (ref 0.7–4.0)
LYMPHS PCT: 16 % (ref 12–46)
MCH: 28.8 pg (ref 26.0–34.0)
MCHC: 33.2 g/dL (ref 30.0–36.0)
MCV: 86.9 fL (ref 78.0–100.0)
Monocytes Absolute: 0.9 10*3/uL (ref 0.1–1.0)
Monocytes Relative: 10 % (ref 3–12)
NEUTROS PCT: 73 % (ref 43–77)
Neutro Abs: 6.6 10*3/uL (ref 1.7–7.7)
PLATELETS: 251 10*3/uL (ref 150–400)
RBC: 4.58 MIL/uL (ref 3.87–5.11)
RDW: 13.4 % (ref 11.5–15.5)
WBC: 9.1 10*3/uL (ref 4.0–10.5)

## 2014-10-15 LAB — PREGNANCY, URINE: PREG TEST UR: NEGATIVE

## 2014-10-15 LAB — BASIC METABOLIC PANEL
Anion gap: 8 (ref 5–15)
BUN: 9 mg/dL (ref 6–23)
CALCIUM: 8.8 mg/dL (ref 8.4–10.5)
CHLORIDE: 105 mmol/L (ref 96–112)
CO2: 24 mmol/L (ref 19–32)
Creatinine, Ser: 0.63 mg/dL (ref 0.50–1.10)
GFR calc non Af Amer: >90 mL/min (ref >90)
GLUCOSE: 156 mg/dL — AB (ref 70–99)
Potassium: 3.7 mmol/L (ref 3.5–5.1)
Sodium: 137 mmol/L (ref 135–145)

## 2014-10-15 MED ORDER — METRONIDAZOLE 500 MG PO TABS: 500.0000 mg | ORAL_TABLET | Freq: Two times a day (BID) | ORAL | Status: DC

## 2014-10-15 MED ORDER — CIPROFLOXACIN HCL 500 MG PO TABS
500.0000 mg | ORAL_TABLET | Freq: Once | ORAL | Status: AC
  Administered 2014-10-15: 500 mg via ORAL
  Filled 2014-10-15: qty 1

## 2014-10-15 MED ORDER — HYDROCODONE-ACETAMINOPHEN 5-325 MG PO TABS: 2.0000 | ORAL_TABLET | ORAL | Status: DC | PRN

## 2014-10-15 MED ORDER — ONDANSETRON HCL 4 MG/2ML IJ SOLN
4.0000 mg | Freq: Once | INTRAMUSCULAR | Status: AC
  Administered 2014-10-15: 4 mg via INTRAVENOUS
  Filled 2014-10-15: qty 2

## 2014-10-15 MED ORDER — CIPROFLOXACIN HCL 500 MG PO TABS: 500.0000 mg | ORAL_TABLET | Freq: Two times a day (BID) | ORAL | Status: DC

## 2014-10-15 MED ORDER — ONDANSETRON HCL 4 MG PO TABS: 4.0000 mg | ORAL_TABLET | Freq: Four times a day (QID) | ORAL | Status: DC

## 2014-10-15 MED ORDER — ACETAMINOPHEN 325 MG PO TABS
650.0000 mg | ORAL_TABLET | Freq: Once | ORAL | Status: AC
  Administered 2014-10-15: 650 mg via ORAL
  Filled 2014-10-15: qty 2

## 2014-10-15 MED ORDER — IOHEXOL 300 MG/ML  SOLN
100.0000 mL | Freq: Once | INTRAMUSCULAR | Status: AC | PRN
  Administered 2014-10-15: 100 mL via INTRAVENOUS

## 2014-10-15 MED ORDER — METRONIDAZOLE 500 MG PO TABS
500.0000 mg | ORAL_TABLET | Freq: Once | ORAL | Status: AC
  Administered 2014-10-15: 500 mg via ORAL
  Filled 2014-10-15: qty 1

## 2014-10-15 MED ORDER — SODIUM CHLORIDE 0.9 % IV BOLUS (SEPSIS)
1000.0000 mL | Freq: Once | INTRAVENOUS | Status: AC
  Administered 2014-10-15: 1000 mL via INTRAVENOUS

## 2014-10-15 MED ORDER — IOHEXOL 300 MG/ML  SOLN
25.0000 mL | Freq: Once | INTRAMUSCULAR | Status: AC | PRN
  Administered 2014-10-15: 25 mL via ORAL

## 2014-10-15 MED ORDER — MORPHINE SULFATE 4 MG/ML IJ SOLN
4.0000 mg | Freq: Once | INTRAMUSCULAR | Status: AC
  Administered 2014-10-15: 4 mg via INTRAVENOUS
  Filled 2014-10-15: qty 1

## 2014-10-15 NOTE — Discharge Instructions (Signed)

## 2014-10-15 NOTE — ED Notes (Signed)
Increasing abdominal pain which has been associated with fever and body aches and chills.  Pt states that she thinks it may be her diverticulitis.

## 2014-10-15 NOTE — ED Provider Notes (Signed)
CSN: [id number]     Arrival date & time 10/15/14  1746 History  This chart was scribed for Ernestina Patches, MD by Erling Conte, ED Scribe. This patient was seen in room MH09/MH09 and the patient's care was started at 9:12 PM.    Chief Complaint  Patient presents with  . Abdominal Pain    Patient is a 44 y.o. female presenting with abdominal pain. The history is provided by the patient. No language interpreter was used.  Abdominal Pain Pain location:  LLQ and RLQ Pain quality: cramping   Pain radiates to:  Does not radiate Pain severity:  Moderate Onset quality:  Gradual Duration:  2 days Timing:  Intermittent Chronicity:  Recurrent (has h/o diverticulitis) Context: not diet changes, not previous surgeries, not recent travel, not sick contacts and not suspicious food intake   Relieved by:  Nothing Worsened by:  Nothing tried Ineffective treatments:  NSAIDs Associated symptoms: chills and fever (101.6 F)   Associated symptoms: no chest pain, no cough, no diarrhea, no dysuria, no fatigue, no hematochezia, no hematuria, no nausea, no shortness of breath, no sore throat and no vomiting     HPI Comments: Natalie Kennedy is a 44 y.o. female with a h/o DM, hypothyroidism, HTN, hyperlipidemia, diverticulitis, and CKD who presents to the Emergency Department complaining of intermittent, cramping, lower abdominal pain for 2 days. Pt is having associated low back pain, fever (t-max 101.6 F), body aches and chills. She states she took Ibuprofen yesterday every 6 hours with initial relief and then the pain came back and the Ibuprofen failed to relieve it. Pt has a h/o of diverticulitis and believes it could be related. She has had similar symptoms with flare ups of her diverticulitis but never had a fever. She denies any h/o abdominal surgeries. She denies any h/o kidney stones or pyelonephritis. She denies any recent antibiotics. Pt went to [place] in January but no other travel out of the country. She  denies any nausea, vomiting, diarrhea, blood in stool, dysuria or hematuria.   Past Medical History  Diagnosis Date  . Diabetes mellitus   . Thyroid disease   . Hypertension     [facility]  . Hypothyroidism   . Hyperlipidemia   . Depression   . Anxiety   . Diverticulitis   . Pseudotumor cerebri     diagnosed with pseudotumor cerebri and treated with lumbar punctures but evaluation by Dr Jannifer Franklin in 2012 indicated migraine headaches  . CKD (chronic kidney disease)     1  . Abnormal Pap smear of cervix    Past Surgical History  Procedure Laterality Date  . Tubal ligation  1998  . Mastoidectomy  20011 and 1982    mastoid surgery   . Tonsillectomy  1979  . Cervix surgery      surgery on cervix for abnormal pap 2009   Family History  Problem Relation Age of Onset  . Diabetes Other   . Diabetes Mother   . Hypertension Mother   . Heart attack Mother     MI (57)  . Diabetes Maternal Grandfather   . Lung cancer Maternal Grandfather   . Heart attack Paternal Grandmother     MI (102)   History  Substance Use Topics  . Smoking status: Never Smoker   . Smokeless tobacco: Not on file  . Alcohol Use: Yes     Comment: ocassionally   OB History    No data available     Review of Systems  Constitutional: Positive for fever (101.6 F) and chills. Negative for diaphoresis, activity change, appetite change and fatigue.  HENT: Negative for congestion, facial swelling, rhinorrhea and sore throat.   Eyes: Negative for photophobia and discharge.  Respiratory: Negative for cough, chest tightness and shortness of breath.   Cardiovascular: Negative for chest pain, palpitations and leg swelling.  Gastrointestinal: Positive for abdominal pain. Negative for nausea, vomiting, diarrhea and hematochezia.  Endocrine: Negative for polydipsia and polyuria.  Genitourinary: Negative for dysuria, frequency, hematuria, difficulty urinating and pelvic pain.  Musculoskeletal: Positive for myalgias and back  pain. Negative for arthralgias, neck pain and neck stiffness.  Skin: Negative for color change and wound.  Allergic/Immunologic: Negative for immunocompromised state.  Neurological: Negative for facial asymmetry, weakness, numbness and headaches.  Hematological: Does not bruise/bleed easily.  Psychiatric/Behavioral: Negative for confusion and agitation.      Allergies  Lisinopril  Home Medications   Prior to Admission medications   Medication Sig Start Date End Date Taking? Authorizing Provider  atorvastatin (LIPITOR) 20 MG tablet Take 20 mg by mouth daily.    Historical Provider, MD  ciprofloxacin (CIPRO) 500 MG tablet Take 1 tablet (500 mg total) by mouth 2 (two) times daily. One po bid x 10 days 10/15/14   Ernestina Patches, MD  escitalopram (LEXAPRO) 10 MG tablet Take 10 mg by mouth daily as needed (mood stabilizer).     Historical Provider, MD  HYDROcodone-acetaminophen (NORCO) 5-325 MG per tablet Take 2 tablets by mouth every 4 (four) hours as needed. 10/15/14   Ernestina Patches, MD  insulin detemir (LEVEMIR) 100 UNIT/ML injection Inject 62 Units into the skin at bedtime.    Historical Provider, MD  levothyroxine (SYNTHROID, LEVOTHROID) 88 MCG tablet Take 88 mcg by mouth daily.      Historical Provider, MD  losartan (COZAAR) 25 MG tablet Take 25 mg by mouth daily.    Historical Provider, MD  Melatonin 3 MG CAPS Take 1 capsule by mouth at bedtime.    Historical Provider, MD  metroNIDAZOLE (FLAGYL) 500 MG tablet Take 1 tablet (500 mg total) by mouth 2 (two) times daily. One po bid x 7 days 10/15/14   Ernestina Patches, MD  ondansetron (ZOFRAN) 4 MG tablet Take 1 tablet (4 mg total) by mouth every 6 (six) hours. 10/15/14   Ernestina Patches, MD  pantoprazole (PROTONIX) 20 MG tablet Take 1 tablet (20 mg total) by mouth daily. 08/04/14   Tiffany Marilu Favre, PA-C  Saxagliptin-Metformin (KOMBIGLYZE XR) 12-998 MG TB24 Take 1 tablet by mouth daily.    Historical Provider, MD   Triage Vitals: BP 128/87 mmHg   Pulse 117  Temp(Src) 100.1 F (37.8 C) (Oral)  Resp 18  Ht 5\' 3"  (1.6 m)  Wt 190 lb (86.183 kg)  BMI 33.67 kg/m2  SpO2 100%  LMP 10/09/2014  Physical Exam  Constitutional: She is oriented to person, place, and time. She appears well-developed and well-nourished. No distress.  HENT:  Head: Normocephalic.  Mouth/Throat: Oropharynx is clear and moist.  Eyes: Pupils are equal, round, and reactive to light.  Neck: Neck supple.  Cardiovascular: Normal rate, regular rhythm and normal heart sounds.   Pulmonary/Chest: Effort normal and breath sounds normal. No respiratory distress. She has no wheezes.  Abdominal: Soft. Bowel sounds are normal. She exhibits no distension. There is tenderness (generalized). There is no rebound and no guarding.  Musculoskeletal: She exhibits no edema or tenderness.  Neurological: She is alert and oriented to person, place, and time.  Skin: Skin  is warm and dry.  Psychiatric: She has a normal mood and affect.  Nursing note and vitals reviewed.   ED Course  Procedures (including critical care time)  DIAGNOSTIC STUDIES: Oxygen Saturation is 100% on RA, normal by my interpretation.    COORDINATION OF CARE:    Labs Review Labs Reviewed  URINALYSIS, ROUTINE W REFLEX MICROSCOPIC - Abnormal; Notable for the following:    APPearance CLOUDY (*)    Ketones, ur 40 (*)    Urobilinogen, UA 2.0 (*)    All other components within normal limits  BASIC METABOLIC PANEL - Abnormal; Notable for the following:    Glucose, Bld 156 (*)    All other components within normal limits  PREGNANCY, URINE  CBC WITH DIFFERENTIAL/PLATELET    Imaging Review Ct Abdomen Pelvis W Contrast  10/15/2014   CLINICAL DATA:  Vomiting began around 9 hours ago. She states she is having some associated watery and black colored diarrhea, chills, lightheadedmess and abdominal pain.Hx: exercise induced asthma, HPV, IUD, Breast surgery.  EXAM: CT ABDOMEN AND PELVIS WITH CONTRAST  TECHNIQUE:  Multidetector CT imaging of the abdomen and pelvis was performed using the standard protocol following bolus administration of intravenous contrast.  CONTRAST:  53mL OMNIPAQUE IOHEXOL 300 MG/ML SOLN, 114mL OMNIPAQUE IOHEXOL 300 MG/ML SOLN  COMPARISON:  02/27/2008  FINDINGS: There is wall thickening along a short segment of the mid sigmoid colon where there are multiple diverticula. There is adjacent fat inflammatory change. There is no free air and no discrete fluid collection is seen to suggest an abscess. Findings are consistent with uncomplicated diverticulitis  . There are numerous other diverticula noted along the colon mostly along the left colon. No other findings of diverticulitis.  Normal small bowel.  Normal appendix.  Clear lung bases.  Heart normal in size.  Liver, spleen, gallbladder, pancreas, adrenal glands, kidneys, ureters, bladder: Unremarkable.  Bilateral tubal ligation clips. Uterus and adnexa otherwise unremarkable.  No pathologically enlarged lymph nodes.  No ascites.  No osteoblastic or osteolytic lesions.  IMPRESSION: 1. Uncomplicated sigmoid diverticulitis. No free air. No evidence of an abscess. 2. No other acute findings.  Pan colonic diverticulosis.   Electronically Signed   By: Lajean Manes M.D.   On: 10/15/2014 23:10     EKG Interpretation None      MDM   Final diagnoses:  Sigmoid diverticulitis    Pt is a 44 y.o. female with Pmhx as above who presents with Milus Banister gradual onset worsening low abdominal pain with associated fevers, chills and body aches.  She states she's had similar symptoms in the past, diverticulitis.  On physical exam she is febrile, mildly tachycardic but nontoxic appearing and in no acute distress.  White blood cell count is normal.  BMP is unremarkable.  Urine does not appear infected. Pregnancy test negative.  CT abdomen pelvis shows a uncomplicated sigmoid diverticulitis.  I feel patient is safe for outpatient treatment with 10 days of Cipro,  Flagyl.  Norco and Zofran, also given for her symptomatically care at home.      Natalie Kennedy evaluation in the Emergency Department is complete. It has been determined that no acute conditions requiring further emergency intervention are present at this time. The patient/guardian have been advised of the diagnosis and plan. We have discussed signs and symptoms that warrant return to the ED, such as changes or worsening in symptoms, worsening pain, fever, inability to tolerate liquids   I personally performed the services described in this documentation,  which was scribed in my presence. The recorded information has been reviewed and is accurate.      Ernestina Patches, MD 10/16/14 [phone]

## 2016-10-01 DIAGNOSIS — E1122 Type 2 diabetes mellitus with diabetic chronic kidney disease: Secondary | ICD-10-CM | POA: Diagnosis not present

## 2016-10-01 DIAGNOSIS — E785 Hyperlipidemia, unspecified: Secondary | ICD-10-CM | POA: Diagnosis not present

## 2016-10-01 DIAGNOSIS — N181 Chronic kidney disease, stage 1: Secondary | ICD-10-CM | POA: Diagnosis not present

## 2016-10-01 DIAGNOSIS — I10 Essential (primary) hypertension: Secondary | ICD-10-CM | POA: Diagnosis not present

## 2016-12-18 DIAGNOSIS — H6691 Otitis media, unspecified, right ear: Secondary | ICD-10-CM | POA: Diagnosis not present

## 2016-12-18 DIAGNOSIS — H6521 Chronic serous otitis media, right ear: Secondary | ICD-10-CM | POA: Diagnosis not present

## 2016-12-18 DIAGNOSIS — H6121 Impacted cerumen, right ear: Secondary | ICD-10-CM | POA: Diagnosis not present

## 2016-12-26 DIAGNOSIS — H7011 Chronic mastoiditis, right ear: Secondary | ICD-10-CM | POA: Diagnosis not present

## 2016-12-26 DIAGNOSIS — H7191 Unspecified cholesteatoma, right ear: Secondary | ICD-10-CM | POA: Diagnosis not present

## 2017-03-12 DIAGNOSIS — H7011 Chronic mastoiditis, right ear: Secondary | ICD-10-CM | POA: Diagnosis not present

## 2017-03-12 DIAGNOSIS — H7101 Cholesteatoma of attic, right ear: Secondary | ICD-10-CM | POA: Diagnosis not present

## 2017-04-16 DIAGNOSIS — S91301A Unspecified open wound, right foot, initial encounter: Secondary | ICD-10-CM | POA: Diagnosis not present

## 2017-05-20 DIAGNOSIS — Z23 Encounter for immunization: Secondary | ICD-10-CM | POA: Diagnosis not present

## 2017-07-22 DIAGNOSIS — H66009 Acute suppurative otitis media without spontaneous rupture of ear drum, unspecified ear: Secondary | ICD-10-CM | POA: Diagnosis not present

## 2017-08-25 DIAGNOSIS — E039 Hypothyroidism, unspecified: Secondary | ICD-10-CM | POA: Diagnosis not present

## 2017-08-25 DIAGNOSIS — E1122 Type 2 diabetes mellitus with diabetic chronic kidney disease: Secondary | ICD-10-CM | POA: Diagnosis not present

## 2017-08-25 DIAGNOSIS — N181 Chronic kidney disease, stage 1: Secondary | ICD-10-CM | POA: Diagnosis not present

## 2017-09-22 DIAGNOSIS — E1122 Type 2 diabetes mellitus with diabetic chronic kidney disease: Secondary | ICD-10-CM | POA: Diagnosis not present

## 2017-09-22 DIAGNOSIS — I10 Essential (primary) hypertension: Secondary | ICD-10-CM | POA: Diagnosis not present

## 2017-09-22 DIAGNOSIS — E039 Hypothyroidism, unspecified: Secondary | ICD-10-CM | POA: Diagnosis not present

## 2017-09-22 DIAGNOSIS — N181 Chronic kidney disease, stage 1: Secondary | ICD-10-CM | POA: Diagnosis not present

## 2017-09-22 DIAGNOSIS — E785 Hyperlipidemia, unspecified: Secondary | ICD-10-CM | POA: Diagnosis not present

## 2017-10-08 DIAGNOSIS — J029 Acute pharyngitis, unspecified: Secondary | ICD-10-CM | POA: Diagnosis not present

## 2017-10-23 DIAGNOSIS — Z0000 Encounter for general adult medical examination without abnormal findings: Secondary | ICD-10-CM | POA: Diagnosis not present

## 2017-10-28 DIAGNOSIS — Z780 Asymptomatic menopausal state: Secondary | ICD-10-CM | POA: Diagnosis not present

## 2017-10-28 DIAGNOSIS — Z6833 Body mass index (BMI) 33.0-33.9, adult: Secondary | ICD-10-CM | POA: Diagnosis not present

## 2017-10-28 DIAGNOSIS — Z01419 Encounter for gynecological examination (general) (routine) without abnormal findings: Secondary | ICD-10-CM | POA: Diagnosis not present

## 2017-11-19 DIAGNOSIS — N924 Excessive bleeding in the premenopausal period: Secondary | ICD-10-CM | POA: Diagnosis not present

## 2017-11-19 DIAGNOSIS — N840 Polyp of corpus uteri: Secondary | ICD-10-CM | POA: Diagnosis not present

## 2017-11-19 DIAGNOSIS — N921 Excessive and frequent menstruation with irregular cycle: Secondary | ICD-10-CM | POA: Diagnosis not present

## 2017-12-11 DIAGNOSIS — N920 Excessive and frequent menstruation with regular cycle: Secondary | ICD-10-CM | POA: Diagnosis not present

## 2017-12-11 DIAGNOSIS — N840 Polyp of corpus uteri: Secondary | ICD-10-CM | POA: Diagnosis not present

## 2017-12-11 DIAGNOSIS — Z3202 Encounter for pregnancy test, result negative: Secondary | ICD-10-CM | POA: Diagnosis not present

## 2017-12-11 DIAGNOSIS — D259 Leiomyoma of uterus, unspecified: Secondary | ICD-10-CM | POA: Diagnosis not present

## 2018-04-24 DIAGNOSIS — N181 Chronic kidney disease, stage 1: Secondary | ICD-10-CM | POA: Diagnosis not present

## 2018-04-24 DIAGNOSIS — I10 Essential (primary) hypertension: Secondary | ICD-10-CM | POA: Diagnosis not present

## 2018-04-24 DIAGNOSIS — E785 Hyperlipidemia, unspecified: Secondary | ICD-10-CM | POA: Diagnosis not present

## 2018-04-24 DIAGNOSIS — E039 Hypothyroidism, unspecified: Secondary | ICD-10-CM | POA: Diagnosis not present

## 2018-04-24 DIAGNOSIS — E1122 Type 2 diabetes mellitus with diabetic chronic kidney disease: Secondary | ICD-10-CM | POA: Diagnosis not present

## 2018-04-24 DIAGNOSIS — Z23 Encounter for immunization: Secondary | ICD-10-CM | POA: Diagnosis not present

## 2018-08-20 DIAGNOSIS — J09X2 Influenza due to identified novel influenza A virus with other respiratory manifestations: Secondary | ICD-10-CM | POA: Diagnosis not present

## 2018-08-20 DIAGNOSIS — R509 Fever, unspecified: Secondary | ICD-10-CM | POA: Diagnosis not present

## 2018-08-20 DIAGNOSIS — E039 Hypothyroidism, unspecified: Secondary | ICD-10-CM | POA: Insufficient documentation

## 2018-08-20 DIAGNOSIS — E785 Hyperlipidemia, unspecified: Secondary | ICD-10-CM | POA: Insufficient documentation

## 2018-08-20 DIAGNOSIS — E119 Type 2 diabetes mellitus without complications: Secondary | ICD-10-CM | POA: Diagnosis not present

## 2018-10-08 ENCOUNTER — Emergency Department (HOSPITAL_COMMUNITY)
Admission: EM | Admit: 2018-10-08 | Discharge: 2018-10-08 | Disposition: A | Discharge status: Home / Self Care | Payer: BLUE CROSS/BLUE SHIELD | Attending: Emergency Medicine | Admitting: Emergency Medicine

## 2018-10-08 ENCOUNTER — Other Ambulatory Visit: Payer: Self-pay

## 2018-10-08 ENCOUNTER — Emergency Department (HOSPITAL_COMMUNITY): Payer: BLUE CROSS/BLUE SHIELD

## 2018-10-08 ENCOUNTER — Encounter (HOSPITAL_COMMUNITY): Payer: Self-pay

## 2018-10-08 DIAGNOSIS — R0789 Other chest pain: Secondary | ICD-10-CM | POA: Diagnosis not present

## 2018-10-08 DIAGNOSIS — N189 Chronic kidney disease, unspecified: Secondary | ICD-10-CM | POA: Insufficient documentation

## 2018-10-08 DIAGNOSIS — I129 Hypertensive chronic kidney disease with stage 1 through stage 4 chronic kidney disease, or unspecified chronic kidney disease: Secondary | ICD-10-CM | POA: Diagnosis not present

## 2018-10-08 DIAGNOSIS — E039 Hypothyroidism, unspecified: Secondary | ICD-10-CM | POA: Insufficient documentation

## 2018-10-08 DIAGNOSIS — Z794 Long term (current) use of insulin: Secondary | ICD-10-CM | POA: Insufficient documentation

## 2018-10-08 DIAGNOSIS — Z79899 Other long term (current) drug therapy: Secondary | ICD-10-CM | POA: Diagnosis not present

## 2018-10-08 DIAGNOSIS — E119 Type 2 diabetes mellitus without complications: Secondary | ICD-10-CM | POA: Diagnosis not present

## 2018-10-08 DIAGNOSIS — R079 Chest pain, unspecified: Secondary | ICD-10-CM | POA: Diagnosis not present

## 2018-10-08 LAB — I-STAT BETA HCG BLOOD, ED (MC, WL, AP ONLY): I-stat hCG, quantitative: <5 m[IU]/mL (ref <5)

## 2018-10-08 LAB — CBC
HEMATOCRIT: 44.2 % (ref 36.0–46.0)
HEMOGLOBIN: 14.3 g/dL (ref 12.0–15.0)
MCH: 29 pg (ref 26.0–34.0)
MCHC: 32.4 g/dL (ref 30.0–36.0)
MCV: 89.7 fL (ref 80.0–100.0)
Platelets: 275 10*3/uL (ref 150–400)
RBC: 4.93 MIL/uL (ref 3.87–5.11)
RDW: 12.8 % (ref 11.5–15.5)
WBC: 6.7 10*3/uL (ref 4.0–10.5)
nRBC: 0 % (ref 0.0–0.2)

## 2018-10-08 LAB — BASIC METABOLIC PANEL
Anion gap: 8 (ref 5–15)
BUN: 12 mg/dL (ref 6–20)
CHLORIDE: 102 mmol/L (ref 98–111)
CO2: 29 mmol/L (ref 22–32)
Calcium: 10.7 mg/dL — ABNORMAL HIGH (ref 8.9–10.3)
Creatinine, Ser: 0.77 mg/dL (ref 0.44–1.00)
GFR calc Af Amer: >60 mL/min (ref >60)
GFR calc non Af Amer: >60 mL/min (ref >60)
GLUCOSE: 117 mg/dL — AB (ref 70–99)
POTASSIUM: 3.6 mmol/L (ref 3.5–5.1)
Sodium: 139 mmol/L (ref 135–145)

## 2018-10-08 LAB — I-STAT TROPONIN, ED: Troponin i, poc: 0 ng/mL (ref 0.00–0.08)

## 2018-10-08 NOTE — ED Provider Notes (Signed)
[facility] EMERGENCY DEPARTMENT Provider Note   CSN: [id number] Arrival date & time: 10/08/18  1513     History   Chief Complaint Chief Complaint  Patient presents with  . Chest Pain    HPI Natalie Kennedy is a 48 y.o. female.  The history is provided by the patient and medical records. No language interpreter was used.     48 year old female with history of diabetes, hypertension, hyperlipidemia, CKD, thyroid disease, non-smoker presenting for evaluation of chest pain.  Patient report for the past 2 days she has had intermittent left-sided chest discomfort.  She states she has persistent heaviness to her left arm and shoulder with occasional pain that radiates to her left upper chest and the left shoulder.  The radiating pain usually lasting for a few seconds.  It is usually nonexertional.  She also complaining of left ear pain, some pain to her left lower leg for the past few days as well.  She is right-hand dominant.  She denies any associated fever or chills, lightheadedness, dizziness, nausea, diaphoresis.  She only endorsed shortness of breath when the pain is intense.  Denies any cough or hemoptysis.  No prior history of PE or DVT, no recent surgery or prolonged bedrest.  No heavy lifting or strenuous activity.  She is not a smoker.  She does admits to heavy family history of cardiac disease and at the urging of her mom she is here for further evaluation.  She denies any active chest pain at this time.  She mentioned having heart catheterization done many years past for similar chest pain but it was normal.  Past Medical History:  Diagnosis Date  . Abnormal Pap smear of cervix   . Anxiety   . CKD (chronic kidney disease)    1  . Depression   . Diabetes mellitus   . Diverticulitis   . Hyperlipidemia   . Hypertension    [facility]  . Hypothyroidism   . Pseudotumor cerebri    diagnosed with pseudotumor cerebri and treated with lumbar punctures but evaluation by  Dr Jannifer Franklin in 2012 indicated migraine headaches  . Thyroid disease     There are no active problems to display for this patient.   Past Surgical History:  Procedure Laterality Date  . CERVIX SURGERY     surgery on cervix for abnormal pap 2009  . MASTOIDECTOMY  20011 and 1982   mastoid surgery   . TONSILLECTOMY  1979  . TUBAL LIGATION  1998     OB History   No obstetric history on file.      Home Medications    Prior to Admission medications   Medication Sig Start Date End Date Taking? Authorizing Provider  atorvastatin (LIPITOR) 20 MG tablet Take 20 mg by mouth daily.    [provider]  ciprofloxacin (CIPRO) 500 MG tablet Take 1 tablet (500 mg total) by mouth 2 (two) times daily. One po bid x 10 days 10/15/14   Ernestina Patches, MD  escitalopram (LEXAPRO) 10 MG tablet Take 10 mg by mouth daily as needed (mood stabilizer).     [provider]  HYDROcodone-acetaminophen (NORCO) 5-325 MG per tablet Take 2 tablets by mouth every 4 (four) hours as needed. 10/15/14   Ernestina Patches, MD  insulin detemir (LEVEMIR) 100 UNIT/ML injection Inject 62 Units into the skin at bedtime.    [provider]  levothyroxine (SYNTHROID, LEVOTHROID) 88 MCG tablet Take 88 mcg by mouth daily.  [provider]  losartan (COZAAR) 25 MG tablet Take 25 mg by mouth daily.    [provider]  Melatonin 3 MG CAPS Take 1 capsule by mouth at bedtime.    [provider]  metroNIDAZOLE (FLAGYL) 500 MG tablet Take 1 tablet (500 mg total) by mouth 2 (two) times daily. One po bid x 7 days 10/15/14   Ernestina Patches, MD  ondansetron (ZOFRAN) 4 MG tablet Take 1 tablet (4 mg total) by mouth every 6 (six) hours. 10/15/14   Ernestina Patches, MD  pantoprazole (PROTONIX) 20 MG tablet Take 1 tablet (20 mg total) by mouth daily. 08/04/14   Delos Haring, PA-C  Saxagliptin-Metformin (KOMBIGLYZE XR) 12-998 MG TB24 Take 1 tablet by mouth daily.    [provider]     Family History Family History  Problem Relation Age of Onset  . Diabetes Other   . Diabetes Mother   . Hypertension Mother   . Heart attack Mother        MI (85)  . Diabetes Maternal Grandfather   . Lung cancer Maternal Grandfather   . Heart attack Paternal Grandmother        MI (89)    Social History Social History   Tobacco Use  . Smoking status: Never Smoker  Substance Use Topics  . Alcohol use: Yes    Comment: ocassionally  . Drug use: No     Allergies   Lisinopril   Review of Systems Review of Systems  All other systems reviewed and are negative.    Physical Exam Updated Vital Signs BP 131/87 (BP Location: Right Arm)   Pulse 82   Temp 98.3 F (36.8 C) (Oral)   Resp 16   Ht 5\' 3"  (1.6 m)   Wt 83.9 kg   SpO2 99%   BMI 32.77 kg/m   Physical Exam Vitals signs and nursing note reviewed.  Constitutional:      General: She is not in acute distress.    Appearance: She is well-developed.  HENT:     Head: Atraumatic.  Eyes:     Conjunctiva/sclera: Conjunctivae normal.  Neck:     Musculoskeletal: Neck supple.  Cardiovascular:     Rate and Rhythm: Normal rate and regular rhythm.     Pulses: Normal pulses.     Heart sounds: Normal heart sounds.  Pulmonary:     Effort: Pulmonary effort is normal.     Breath sounds: Normal breath sounds. No wheezing, rhonchi or rales.  Chest:     Chest wall: Tenderness (Mild tenderness to left upper chest on palpation without any overlying skin changes, no crepitus or emphysema.  Left shoulder with full range of motion.) present.  Abdominal:     Palpations: Abdomen is soft.     Tenderness: There is no abdominal tenderness.  Musculoskeletal:        General: No swelling or tenderness.  Skin:    Capillary Refill: Capillary refill takes less than 2 seconds.     Findings: No rash.  Neurological:     Mental Status: She is alert and oriented to person, place, and time.      ED Treatments / Results  Labs (all  labs ordered are listed, but only abnormal results are displayed) Labs Reviewed  BASIC METABOLIC PANEL - Abnormal; Notable for the following components:      Result Value   Glucose, Bld 117 (*)    Calcium 10.7 (*)    All other components within normal limits  CBC  I-STAT BETA HCG BLOOD, ED ([facility], [facility], [facility] ONLY)  I-STAT TROPONIN, ED    EKG None   Date: 10/08/2018  Rate: 84  Rhythm: normal sinus rhythm  QRS Axis: normal  Intervals: normal  ST/T Wave abnormalities: normal  Conduction Disutrbances: none  Narrative Interpretation:   Old EKG Reviewed: No significant changes noted     Radiology Dg Chest 2 View  Result Date: 10/08/2018 CLINICAL DATA:  Pt arrives POV for eval of CP onset 2-3 days. Pt reports episodic chest pain the last few days, started w/ L arm heaviness and pain 2 days PTA. Called PCP today for eval and they advised her to present here. Family history of cardiovascular disease. EXAM: CHEST - 2 VIEW COMPARISON:  08/04/2014 FINDINGS: The heart size and mediastinal contours are within normal limits. Both lungs are clear. The visualized skeletal structures are unremarkable. IMPRESSION: No active cardiopulmonary disease. Electronically Signed   By: Nolon Nations M.D.   On: 10/08/2018 16:44    Procedures Procedures (including critical care time)  Medications Ordered in ED Medications - No data to display   Initial Impression / Assessment and Plan / ED Course  I have reviewed the triage vital signs and the nursing notes.  Pertinent labs & imaging results that were available during my care of the patient were reviewed by me and considered in my medical decision making (see chart for details).     BP 135/88   Pulse 78   Temp 98.3 F (36.8 C) (Oral)   Resp 19   Ht 5\' 3"  (1.6 m)   Wt 83.9 kg   SpO2 97%   BMI 32.77 kg/m    Final Clinical Impressions(s) / ED Diagnoses   Final diagnoses:  Atypical chest pain    ED Discharge Orders    None     7:16  PM Patient here with left shoulder and left upper chest pain for the past 2 days.  Pain is atypical of ACS.  She does have some risk factor for cardiac disease.  Her heart score is 3, low risk of mace.  She is PERC negative, doubt PE.  9:42 PM No significant neck discomfort to suggest cervical radiculopathy, or brachial plexus radiculopathy.  She is neurovascular intact.  Encourage patient to call and follow-up closely with PCP to have cardiac stress test outpatient for further evaluation.  Return precaution discussed.  Patient voiced understanding and agrees with plan.   Domenic Moras, PA-C 10/08/18 2146    Duffy Bruce, MD 10/09/18 [phone]

## 2018-10-08 NOTE — ED Triage Notes (Signed)
Pt arrives POV for eval of CP onset 2-3 days PTA. Pt reports episodic chest pain the last few days, started w/ L arm heaviness and pain 2 days PTA. Called PCP today for eval and they advised her to present here. Pt reports fam hx of bypass by the age of 1 in mom and grandma so she "just wants to get checked out".

## 2018-10-08 NOTE — ED Notes (Signed)
Family at bedside. 

## 2018-10-08 NOTE — ED Notes (Signed)
Patient verbalizes understanding of discharge instructions. Opportunity for questioning and answers were provided. Armband removed by staff, pt discharged from ED. Pt amb to lobby

## 2018-10-08 NOTE — ED Notes (Signed)
ED Provider at bedside. 

## 2018-10-12 DIAGNOSIS — R0789 Other chest pain: Secondary | ICD-10-CM | POA: Diagnosis not present

## 2018-10-12 DIAGNOSIS — E785 Hyperlipidemia, unspecified: Secondary | ICD-10-CM | POA: Diagnosis not present

## 2018-10-12 DIAGNOSIS — I10 Essential (primary) hypertension: Secondary | ICD-10-CM | POA: Diagnosis not present

## 2018-10-21 ENCOUNTER — Encounter: Payer: Self-pay | Admitting: *Deleted

## 2018-10-21 DIAGNOSIS — E119 Type 2 diabetes mellitus without complications: Secondary | ICD-10-CM | POA: Insufficient documentation

## 2018-10-21 DIAGNOSIS — I209 Angina pectoris, unspecified: Secondary | ICD-10-CM | POA: Insufficient documentation

## 2018-10-22 NOTE — Progress Notes (Signed)
Cardiology Office Note:    Date:  10/23/2018   ID:  Natalie Kennedy, DOB [birth year], MRN [id number]  PCP:  Donald Prose, MD  Cardiologist:  Shirlee More, MD   Referring MD: Donald Prose, MD  ASSESSMENT:    1. Chest pain, unspecified type   2. Angina pectoris ([place])   3. Type 2 diabetes mellitus without complication, with long-term current use of insulin ([place])   4. Pure hypercholesterolemia    PLAN:    In order of problems listed above:  1. She is presenting with a chronic chest pain pattern for several months consistent with angina pectoris recent ED evaluation showed no evidence of acute coronary syndrome she had normal coronary geography in 2012 and after discussion of techniques either stress testing or cardiac CTA we chose to undergo cardiac CTA and will try to expedite this.  In the interim she will take coated aspirin 81 mg daily started on rate limiting calcium channel blocker given a prescription for nitroglycerin.  I will see her back in my office once the test is performed 2. Stable continue current  3. Continue statin if she has CAD she will require more aggressive treatment PCSK9 or Zetia  Next appointment 6 months   Medication Adjustments/Labs and Tests Ordered: Current medicines are reviewed at length with the patient today.  Concerns regarding medicines are outlined above.  Orders Placed This Encounter  Procedures  . CT CORONARY MORPH W/CTA COR W/SCORE W/CA W/CM &/OR WO/CM  . CT CORONARY FRACTIONAL FLOW RESERVE DATA PREP  . CT CORONARY FRACTIONAL FLOW RESERVE FLUID ANALYSIS  . EKG 12-Lead   No orders of the defined types were placed in this encounter.    Chief Complaint  Patient presents with  . Chest Pain    History of Present Illness:    Natalie Kennedy is a 48 y.o. female with hypertension, hyperlipidemia and T2 DM who is being seen today for the evaluation of chest pain at the request of Donald Prose, MD after ED visit 10/08/18 with a normal EKG and  troponin. She had normal coronary arteriography in July 2012.  Since December she has had a pattern of exercise intolerance with exertional aching in the left chest and shortness of breath relieved with rest.  It happens walking a long distance at work and happen when she walked in from the parking garage abnormally.  She also has had a more persistent nagging aching in the left shoulder and left arm that occurs during activities with the day but tends to persist is improved with rest but it never quite goes away and in general these are the same symptoms to follow each other temporarily.  The discomfort is moderate associated shortness of breath not pleuritic no nausea or vomiting.  She had normal coronary angiography in 2012 both of [place] think she is having typical angina and we chose cardiac CTA as a diagnostic technique for chronic chest pain syndrome and she will undergo this expedited and start medical therapy with coated aspirin calcium channel blocker continue her statin nitroglycerin.  She has no dye allergy or renal insufficiency.  Her family history is striking with the mother of bypass surgery in her 43s and maternal grandmother at age 60.  Past Medical History:  Diagnosis Date  . Abnormal Pap smear of cervix   . Anxiety   . CKD (chronic kidney disease)    1  . Depression   . Diabetes mellitus   . Diverticulitis   . Hyperlipidemia   .  Hypertension    [facility]  . Hypothyroidism   . Pseudotumor cerebri    diagnosed with pseudotumor cerebri and treated with lumbar punctures but evaluation by Dr Jannifer Franklin in 2012 indicated migraine headaches  . Thyroid disease     Past Surgical History:  Procedure Laterality Date  . CERVIX SURGERY     surgery on cervix for abnormal pap 2009  . MASTOIDECTOMY  20011 and 1982   mastoid surgery   . TONSILLECTOMY  1979  . TUBAL LIGATION  1998    Current Medications: Current Meds  Medication Sig  . APPLE CIDER VINEGAR PO Take 2 tablets by mouth daily.    Marland Kitchen atorvastatin (LIPITOR) 20 MG tablet Take 20 mg by mouth 2 (two) times a week.   . insulin detemir (LEVEMIR) 100 UNIT/ML injection Inject 40 Units into the skin at bedtime.   Marland Kitchen levothyroxine (SYNTHROID, LEVOTHROID) 88 MCG tablet Take 88 mcg by mouth daily.    Marland Kitchen losartan (COZAAR) 25 MG tablet Take 25 mg by mouth 2 (two) times a week.   . Multiple Vitamin (MULTIVITAMIN) tablet Take 1 tablet by mouth daily.  . Probiotic Product ([place]) CAPS Take 1 capsule by mouth daily.  . Saxagliptin-Metformin (KOMBIGLYZE XR) 12-998 MG TB24 Take 1 tablet by mouth daily.     Allergies:   Lisinopril   Social History   Socioeconomic History  . Marital status: Married    Spouse name: Not on file  . Number of children: Not on file  . Years of education: Not on file  . Highest education level: Not on file  Occupational History  . Not on file  Social Needs  . Financial resource strain: Not on file  . Food insecurity:    Worry: Not on file    Inability: Not on file  . Transportation needs:    Medical: Not on file    Non-medical: Not on file  Tobacco Use  . Smoking status: Former Smoker    Packs/day: 1.00    Years: 10.00    Pack years: 10.00    Types: Cigarettes    Last attempt to quit: 2007    Years since quitting: 13.1  . Smokeless tobacco: Never Used  Substance and Sexual Activity  . Alcohol use: Yes    Comment: ocassionally  . Drug use: No  . Sexual activity: Not on file  Lifestyle  . Physical activity:    Days per week: Not on file    Minutes per session: Not on file  . Stress: Not on file  Relationships  . Social connections:    Talks on phone: Not on file    Gets together: Not on file    Attends religious service: Not on file    Active member of club or organization: Not on file    Attends meetings of clubs or organizations: Not on file    Relationship status: Not on file  Other Topics Concern  . Not on file  Social History Narrative  . Not on file      Family History: The patient's family history includes CAD in her maternal grandmother and mother; Diabetes in her maternal grandfather, maternal grandmother, maternal uncle, mother, and another family member; Heart attack in her mother and paternal grandmother; Hypertension in her mother; Lung cancer in her maternal grandfather.  ROS:   Review of Systems  Constitution: Positive for malaise/fatigue.  HENT: Negative.   Eyes: Negative.   Cardiovascular: Positive for chest pain and dyspnea on exertion.  Respiratory: Positive for shortness of breath.   Endocrine: Negative.   Hematologic/Lymphatic: Negative.   Skin: Negative.   Musculoskeletal: Positive for back pain.  Gastrointestinal: Negative.   Genitourinary: Negative.   Neurological: Positive for dizziness.  Psychiatric/Behavioral: Negative.   Allergic/Immunologic: Negative.    Please see the history of present illness.     All other systems reviewed and are negative.  EKGs/Labs/Other Studies Reviewed:    The following studies were reviewed today: [facility], ED records reviewed prior to visit EKG from ED personally reviewed and my office today EKG 10/08/18 personally reviewed, [facility] low voltage OW normal EKG:  EKG is  ordered today.  The ekg ordered today demonstrates sinus rhythm R wave progression low voltage no ischemic changes  Recent Labs: 10/08/2018: BUN 12; Creatinine, Ser 0.77; Hemoglobin 14.3; Platelets 275; Potassium 3.6; Sodium 139  Recent Lipid Panel   recent lipid profile cholesterol 191 HDL 43 LDL 100 123.    Component Value Date/Time   CHOL (H) 05/01/2009 1140    245        ATP III CLASSIFICATION:  <200     mg/dL   Desirable  200-239  mg/dL   Borderline High  >=240    mg/dL   High          TRIG 447 (H) 05/01/2009 1140   HDL 35 (L) 05/01/2009 1140   CHOLHDL 7.0 05/01/2009 1140   VLDL UNABLE TO CALCULATE IF TRIGLYCERIDE OVER 400 mg/dL 05/01/2009 1140   LDLCALC  05/01/2009 1140    UNABLE TO CALCULATE IF  TRIGLYCERIDE OVER 400 mg/dL        Total Cholesterol/HDL:CHD Risk Coronary Heart Disease Risk Table                     Men   Women  1/2 Average Risk   3.4   3.3  Average Risk       5.0   4.4  2 X Average Risk   9.6   7.1  3 X Average Risk  23.4   11.0        Use the calculated Patient Ratio above and the CHD Risk Table to determine the patient's CHD Risk.        ATP III CLASSIFICATION (LDL):  <100     mg/dL   Optimal  100-129  mg/dL   Near or Above                    Optimal  130-159  mg/dL   Borderline  160-189  mg/dL   High  >190     mg/dL   Very High    Physical Exam:    VS:  BP 126/90 (BP Location: Left Arm, Patient Position: Sitting, Cuff Size: Large)   Pulse 77   Ht 5\' 3"  (1.6 m)   Wt 193 lb (87.5 kg)   SpO2 98%   BMI 34.19 kg/m     Wt Readings from Last 3 Encounters:  10/23/18 193 lb (87.5 kg)  10/08/18 185 lb (83.9 kg)  10/15/14 190 lb (86.2 kg)     GEN:  Well nourished, well developed in no acute distress HEENT: Normal NECK: No JVD; No carotid bruits LYMPHATICS: No lymphadenopathy CARDIAC: RRR, no murmurs, rubs, gallops RESPIRATORY:  Clear to auscultation without rales, wheezing or rhonchi  ABDOMEN: Soft, non-tender, non-distended MUSCULOSKELETAL:  No edema; No deformity  SKIN: Warm and dry NEUROLOGIC:  Alert and oriented x 3 PSYCHIATRIC:  Normal affect  Signed, Shirlee More, MD  10/23/2018 10:30 AM    [place]

## 2018-10-23 ENCOUNTER — Ambulatory Visit: Payer: BLUE CROSS/BLUE SHIELD | Admitting: Cardiology

## 2018-10-23 ENCOUNTER — Encounter: Payer: Self-pay | Admitting: Cardiology

## 2018-10-23 VITALS — BP 126/90 | HR 77 | Ht 63.0 in | Wt 193.0 lb

## 2018-10-23 DIAGNOSIS — Z794 Long term (current) use of insulin: Secondary | ICD-10-CM

## 2018-10-23 DIAGNOSIS — R079 Chest pain, unspecified: Secondary | ICD-10-CM | POA: Diagnosis not present

## 2018-10-23 DIAGNOSIS — I209 Angina pectoris, unspecified: Secondary | ICD-10-CM | POA: Diagnosis not present

## 2018-10-23 DIAGNOSIS — E7800 Pure hypercholesterolemia, unspecified: Secondary | ICD-10-CM

## 2018-10-23 DIAGNOSIS — E119 Type 2 diabetes mellitus without complications: Secondary | ICD-10-CM

## 2018-10-23 MED ORDER — NITROGLYCERIN 0.4 MG SL SUBL: 0.4000 mg | SUBLINGUAL_TABLET | SUBLINGUAL | 11 refills | Status: AC | PRN

## 2018-10-23 MED ORDER — ASPIRIN EC 81 MG PO TBEC: 81.0000 mg | DELAYED_RELEASE_TABLET | Freq: Every day | ORAL | 3 refills | Status: AC

## 2018-10-23 MED ORDER — METOPROLOL TARTRATE 50 MG PO TABS: 50.0000 mg | ORAL_TABLET | Freq: Once | ORAL | 0 refills | Status: AC

## 2018-10-23 MED ORDER — DILTIAZEM HCL ER COATED BEADS 240 MG PO CP24: 240.0000 mg | ORAL_CAPSULE | Freq: Every day | ORAL | 3 refills | Status: AC

## 2018-10-23 NOTE — Patient Instructions (Addendum)
Medication Instructions:   Your physician has recommended you make the following change in your medication:   START: Cardizem 240 mg by mouth once daily.  START: Coated Aspirin 81 mg by mouth once daily.   START: Nitroglycerin 0.4 mg sublingual ( under the tonuge) as needed for chest pain ONLY!    When having chest pain, stop what you are doing and sit down. Take 1 nitro, wait 5 minutes. Still having chest pain, take 1 nitro, wait 5 minutes. Still having chest pain, take 1 nitro, dial 911. Total of 3 nitro in 15 minutes.    If you need a refill on your cardiac medications before your next appointment, please call your pharmacy.   Lab work:  Your physician recommends that you return for lab work 3-7 days prior to Cardiac CTA.   If you have labs (blood work) drawn today and your tests are completely normal, you will receive your results only by: Marland Kitchen MyChart Message (if you have MyChart) OR . A paper copy in the mail If you have any lab test that is abnormal or we need to change your treatment, we will call you to review the results.  Testing/Procedures:  You had an EKG today.  Please arrive at the [facility] main entrance of [facility] at xx:xx AM (30-45 minutes prior to test start time)  [facility] [place], [place] [address] [phone]  Proceed to the [facility] Radiology Department (First Floor).  Please follow these instructions carefully (unless otherwise directed):   On the Night Before the Test: . Be sure to Drink plenty of water. . Do not consume any caffeinated/decaffeinated beverages or chocolate 12 hours prior to your test. . Do not take any antihistamines 12 hours prior to your test. . If you take [facility] do not take 24 hours prior to test. . Only take half dose of LEVEMIR   On the Day of the Test: . Drink plenty of water. Do not drink any water within one hour of the test. . Do not eat any food 4 hours prior to  the test. . You may take your regular medications prior to the test.  . Take metoprolol (Lopressor) two hours prior to test.   *For Clinical Staff only. Please instruct patient the following:*        -Drink plenty of water       -Take metoprolol (Lopressor) 2 hours prior to test (if applicable).                  -If HR is less than 55 BPM- No Beta Blocker                -IF HR is greater than 55 BPM and patient is less than or equal to 18 yrs old Lopressor 100mg  x1.                  After the Test: . Drink plenty of water. . After receiving IV contrast, you may experience a mild flushed feeling. This is normal. . On occasion, you may experience a mild rash up to 24 hours after the test. This is not dangerous. If this occurs, you can take Benadryl 25 mg and increase your fluid intake. . If you experience trouble breathing, this can be serious. If it is severe call 911 IMMEDIATELY. If it is mild, please call our office.   If you take any of these medications: KOMBIGLYZE Glipizide/Metformin, Avandament, Glucavance, please  do not take 48 hours after completing test.   Follow-Up: At [facility], you and your health needs are our priority.  As part of our continuing mission to provide you with exceptional heart care, we have created designated Provider Care Teams.  These Care Teams include your primary Cardiologist (physician) and Advanced Practice Providers (APPs -  Physician Assistants and Nurse Practitioners) who all work together to provide you with the care you need, when you need it.  . You will need a follow up appointment in 6 weeks.    Any Other Special Instructions Will Be Listed Below (If Applicable).   Nitroglycerin sublingual tablets What is this medicine? NITROGLYCERIN (nye troe GLI ser in) is a type of vasodilator. It relaxes blood vessels, increasing the blood and oxygen supply to your heart. This medicine is used to relieve chest pain caused by angina. It is also used to  prevent chest pain before activities like climbing stairs, going outdoors in cold weather, or sexual activity. This medicine may be used for other purposes; ask your health care provider or pharmacist if you have questions. COMMON BRAND NAME(S): Nitroquick, Nitrostat, Nitrotab What should I tell my health care provider before I take this medicine? They need to know if you have any of these conditions: -anemia -head injury, recent stroke, or bleeding in the brain -liver disease -previous heart attack -an unusual or allergic reaction to nitroglycerin, other medicines, foods, dyes, or preservatives -pregnant or trying to get pregnant -breast-feeding How should I use this medicine? Take this medicine by mouth as needed. At the first sign of an angina attack (chest pain or tightness) place one tablet under your tongue. You can also take this medicine 5 to 10 minutes before an event likely to produce chest pain. Follow the directions on the prescription label. Let the tablet dissolve under the tongue. Do not swallow whole. Replace the dose if you accidentally swallow it. It will help if your mouth is not dry. Saliva around the tablet will help it to dissolve more quickly. Do not eat or drink, smoke or chew tobacco while a tablet is dissolving. If you are not better within 5 minutes after taking ONE dose of nitroglycerin, call 9-1-1 immediately to seek emergency medical care. Do not take more than 3 nitroglycerin tablets over 15 minutes. If you take this medicine often to relieve symptoms of angina, your doctor or health care professional may provide you with different instructions to manage your symptoms. If symptoms do not go away after following these instructions, it is important to call 9-1-1 immediately. Do not take more than 3 nitroglycerin tablets over 15 minutes. Talk to your pediatrician regarding the use of this medicine in children. Special care may be needed. Overdosage: If you think you have  taken too much of this medicine contact a poison control center or emergency room at once. NOTE: This medicine is only for you. Do not share this medicine with others. What if I miss a dose? This does not apply. This medicine is only used as needed. What may interact with this medicine? Do not take this medicine with any of the following medications: -certain migraine medicines like ergotamine and dihydroergotamine (DHE) -medicines used to treat erectile dysfunction like sildenafil, tadalafil, and vardenafil -riociguat This medicine may also interact with the following medications: -alteplase -aspirin -heparin -medicines for high blood pressure -medicines for mental depression -other medicines used to treat angina -phenothiazines like chlorpromazine, mesoridazine, prochlorperazine, thioridazine This list may not describe all possible  interactions. Give your health care provider a list of all the medicines, herbs, non-prescription drugs, or dietary supplements you use. Also tell them if you smoke, drink alcohol, or use illegal drugs. Some items may interact with your medicine. What should I watch for while using this medicine? Tell your doctor or health care professional if you feel your medicine is no longer working. Keep this medicine with you at all times. Sit or lie down when you take your medicine to prevent falling if you feel dizzy or faint after using it. Try to remain calm. This will help you to feel better faster. If you feel dizzy, take several deep breaths and lie down with your feet propped up, or bend forward with your head resting between your knees. You may get drowsy or dizzy. Do not drive, use machinery, or do anything that needs mental alertness until you know how this drug affects you. Do not stand or sit up quickly, especially if you are an older patient. This reduces the risk of dizzy or fainting spells. Alcohol can make you more drowsy and dizzy. Avoid alcoholic drinks. Do  not treat yourself for coughs, colds, or pain while you are taking this medicine without asking your doctor or health care professional for advice. Some ingredients may increase your blood pressure. What side effects may I notice from receiving this medicine? Side effects that you should report to your doctor or health care professional as soon as possible: -blurred vision -dry mouth -skin rash -sweating -the feeling of extreme pressure in the head -unusually weak or tired Side effects that usually do not require medical attention (report to your doctor or health care professional if they continue or are bothersome): -flushing of the face or neck -headache -irregular heartbeat, palpitations -nausea, vomiting This list may not describe all possible side effects. Call your doctor for medical advice about side effects. You may report side effects to FDA at 1-800-FDA-1088. Where should I keep my medicine? Keep out of the reach of children. Store at room temperature between 20 and 25 degrees C (68 and 77 degrees F). Store in Chief of Staff. Protect from light and moisture. Keep tightly closed. Throw away any unused medicine after the expiration date. NOTE: This sheet is a summary. It may not cover all possible information. If you have questions about this medicine, talk to your doctor, pharmacist, or health care provider.  2019 Elsevier/Gold Standard (2013-06-10 17:57:36)

## 2018-10-26 DIAGNOSIS — E785 Hyperlipidemia, unspecified: Secondary | ICD-10-CM | POA: Diagnosis not present

## 2018-10-26 DIAGNOSIS — E1122 Type 2 diabetes mellitus with diabetic chronic kidney disease: Secondary | ICD-10-CM | POA: Diagnosis not present

## 2018-10-26 DIAGNOSIS — N181 Chronic kidney disease, stage 1: Secondary | ICD-10-CM | POA: Diagnosis not present

## 2018-10-26 DIAGNOSIS — E039 Hypothyroidism, unspecified: Secondary | ICD-10-CM | POA: Diagnosis not present

## 2018-10-26 DIAGNOSIS — I10 Essential (primary) hypertension: Secondary | ICD-10-CM | POA: Diagnosis not present

## 2018-10-26 DIAGNOSIS — Z0000 Encounter for general adult medical examination without abnormal findings: Secondary | ICD-10-CM | POA: Diagnosis not present

## 2018-11-03 DIAGNOSIS — Z6833 Body mass index (BMI) 33.0-33.9, adult: Secondary | ICD-10-CM | POA: Diagnosis not present

## 2018-11-03 DIAGNOSIS — Z1231 Encounter for screening mammogram for malignant neoplasm of breast: Secondary | ICD-10-CM | POA: Diagnosis not present

## 2018-11-03 DIAGNOSIS — Z01419 Encounter for gynecological examination (general) (routine) without abnormal findings: Secondary | ICD-10-CM | POA: Diagnosis not present

## 2018-11-27 DIAGNOSIS — F331 Major depressive disorder, recurrent, moderate: Secondary | ICD-10-CM | POA: Diagnosis not present

## 2018-12-01 ENCOUNTER — Telehealth: Payer: Self-pay | Admitting: Cardiology

## 2018-12-01 NOTE — Telephone Encounter (Signed)
° °  Primary Cardiologist:  [facility]  Patient contacted.  History reviewed.  No symptoms to suggest any unstable cardiac conditions.  Based on discussion, with current pandemic situation, we will be postponing this appointment for Natalie Kennedy with a plan for f/u in 6-12 wks or sooner if feasible/necessary.  If symptoms change, she has been instructed to contact our office.     Calla Kicks  12/01/2018 2:15 PM         .

## 2018-12-01 NOTE — Telephone Encounter (Signed)
2-4/10 °

## 2018-12-04 ENCOUNTER — Ambulatory Visit: Payer: BLUE CROSS/BLUE SHIELD | Admitting: Cardiology

## 2019-01-22 ENCOUNTER — Telehealth: Payer: Self-pay | Admitting: Cardiology

## 2019-01-22 DIAGNOSIS — Z01812 Encounter for preprocedural laboratory examination: Secondary | ICD-10-CM

## 2019-01-22 DIAGNOSIS — I209 Angina pectoris, unspecified: Secondary | ICD-10-CM

## 2019-01-22 NOTE — Telephone Encounter (Signed)
Left message to call and schedule ct .

## 2019-01-25 DIAGNOSIS — I209 Angina pectoris, unspecified: Secondary | ICD-10-CM | POA: Diagnosis not present

## 2019-01-25 DIAGNOSIS — Z01812 Encounter for preprocedural laboratory examination: Secondary | ICD-10-CM | POA: Diagnosis not present

## 2019-01-26 ENCOUNTER — Telehealth (HOSPITAL_COMMUNITY): Payer: Self-pay | Admitting: Emergency Medicine

## 2019-01-26 LAB — BASIC METABOLIC PANEL
BUN/Creatinine Ratio: 15 (ref 9–23)
BUN: 9 mg/dL (ref 6–24)
CO2: 23 mmol/L (ref 20–29)
Calcium: 9.2 mg/dL (ref 8.7–10.2)
Chloride: 99 mmol/L (ref 96–106)
Creatinine, Ser: 0.61 mg/dL (ref 0.57–1.00)
GFR calc Af Amer: 123 mL/min/{1.73_m2} (ref >59)
GFR calc non Af Amer: 107 mL/min/{1.73_m2} (ref >59)
Glucose: 247 mg/dL — ABNORMAL HIGH (ref 65–99)
Potassium: 4.6 mmol/L (ref 3.5–5.2)
Sodium: 138 mmol/L (ref 134–144)

## 2019-01-26 NOTE — Telephone Encounter (Signed)
Reaching out to patient to offer assistance regarding upcoming cardiac imaging study; pt verbalizes understanding of appt date/time, parking situation and where to check in, pre-test NPO status and medications ordered, and verified current allergies; name and call back number provided for further questions should they arise Chrisann Melaragno RN Navigator Cardiac Imaging [facility] [phone] office [phone] cell  Pt denies covid symptoms, verbalized understanding of visitor policy. 

## 2019-01-27 ENCOUNTER — Ambulatory Visit (HOSPITAL_COMMUNITY)
Admission: RE | Admit: 2019-01-27 | Discharge: 2019-01-27 | Disposition: A | Discharge status: Home / Self Care | Payer: BC Managed Care – PPO | Source: Ambulatory Visit | Attending: Cardiology | Admitting: Cardiology

## 2019-01-27 ENCOUNTER — Ambulatory Visit (HOSPITAL_COMMUNITY): Admission: RE | Admit: 2019-01-27 | Payer: BC Managed Care – PPO | Source: Ambulatory Visit

## 2019-01-27 ENCOUNTER — Other Ambulatory Visit: Payer: Self-pay

## 2019-01-27 ENCOUNTER — Encounter: Payer: BC Managed Care – PPO | Admitting: *Deleted

## 2019-01-27 VITALS — Temp 98.1°F

## 2019-01-27 DIAGNOSIS — Z006 Encounter for examination for normal comparison and control in clinical research program: Secondary | ICD-10-CM

## 2019-01-27 DIAGNOSIS — R079 Chest pain, unspecified: Secondary | ICD-10-CM | POA: Diagnosis not present

## 2019-01-27 DIAGNOSIS — I209 Angina pectoris, unspecified: Secondary | ICD-10-CM | POA: Diagnosis not present

## 2019-01-27 MED ORDER — METOPROLOL TARTRATE 5 MG/5ML IV SOLN
INTRAVENOUS | Status: AC
  Administered 2019-01-27: 08:00:00 5 mg
  Filled 2019-01-27: qty 20

## 2019-01-27 MED ORDER — IOHEXOL 350 MG/ML SOLN
90.0000 mL | Freq: Once | INTRAVENOUS | Status: AC | PRN
  Administered 2019-01-27: 90 mL via INTRAVENOUS

## 2019-01-27 MED ORDER — METOPROLOL TARTRATE 5 MG/5ML IV SOLN
5.0000 mg | INTRAVENOUS | Status: DC | PRN
  Filled 2019-01-27: qty 5

## 2019-01-27 MED ORDER — NITROGLYCERIN 0.4 MG SL SUBL
SUBLINGUAL_TABLET | SUBLINGUAL | Status: AC
  Filled 2019-01-27: qty 2

## 2019-01-27 MED ORDER — NITROGLYCERIN 0.4 MG SL SUBL
0.8000 mg | SUBLINGUAL_TABLET | SUBLINGUAL | Status: DC | PRN
  Administered 2019-01-27: 0.8 mg via SUBLINGUAL
  Filled 2019-01-27 (×2): qty 25

## 2019-01-27 NOTE — Research (Signed)
Subject Name: Natalie Kennedy  Subject met inclusion and exclusion criteria.  The informed consent form, study requirements and expectations were reviewed with the subject and questions and concerns were addressed prior to the signing of the consent form.  The subject verbalized understanding of the trial requirements.  The subject agreed to participate in the CADFEM trial and signed the informed consent at 0730 on06/03/20.  The informed consent was obtained prior to performance of any protocol-specific procedures for the subject.  A copy of the signed informed consent was given to the subject and a copy was placed in the subject's medical record.   [facility] [place]

## 2019-02-20 DIAGNOSIS — Z20828 Contact with and (suspected) exposure to other viral communicable diseases: Secondary | ICD-10-CM | POA: Diagnosis not present

## 2019-05-18 DIAGNOSIS — E1122 Type 2 diabetes mellitus with diabetic chronic kidney disease: Secondary | ICD-10-CM | POA: Diagnosis not present

## 2019-05-18 DIAGNOSIS — E785 Hyperlipidemia, unspecified: Secondary | ICD-10-CM | POA: Diagnosis not present

## 2019-05-18 DIAGNOSIS — N181 Chronic kidney disease, stage 1: Secondary | ICD-10-CM | POA: Diagnosis not present

## 2019-05-18 DIAGNOSIS — I10 Essential (primary) hypertension: Secondary | ICD-10-CM | POA: Diagnosis not present

## 2019-05-19 DIAGNOSIS — M65312 Trigger thumb, left thumb: Secondary | ICD-10-CM | POA: Diagnosis not present

## 2019-05-19 DIAGNOSIS — M79645 Pain in left finger(s): Secondary | ICD-10-CM | POA: Diagnosis not present

## 2019-06-16 DIAGNOSIS — M79645 Pain in left finger(s): Secondary | ICD-10-CM | POA: Diagnosis not present

## 2019-06-16 DIAGNOSIS — M6530 Trigger finger, unspecified finger: Secondary | ICD-10-CM | POA: Diagnosis not present

## 2019-06-18 ENCOUNTER — Ambulatory Visit: Payer: BC Managed Care – PPO | Admitting: Cardiology

## 2019-09-16 DIAGNOSIS — Z20828 Contact with and (suspected) exposure to other viral communicable diseases: Secondary | ICD-10-CM | POA: Diagnosis not present

## 2019-09-16 DIAGNOSIS — R05 Cough: Secondary | ICD-10-CM | POA: Diagnosis not present

## 2019-09-16 DIAGNOSIS — R0602 Shortness of breath: Secondary | ICD-10-CM | POA: Diagnosis not present

## 2019-09-16 DIAGNOSIS — R519 Headache, unspecified: Secondary | ICD-10-CM | POA: Diagnosis not present

## 2019-10-04 DIAGNOSIS — M65312 Trigger thumb, left thumb: Secondary | ICD-10-CM | POA: Diagnosis not present

## 2019-10-04 DIAGNOSIS — M79645 Pain in left finger(s): Secondary | ICD-10-CM | POA: Diagnosis not present

## 2019-10-07 DIAGNOSIS — R519 Headache, unspecified: Secondary | ICD-10-CM | POA: Diagnosis not present

## 2019-10-07 DIAGNOSIS — R197 Diarrhea, unspecified: Secondary | ICD-10-CM | POA: Diagnosis not present

## 2019-10-07 DIAGNOSIS — J0100 Acute maxillary sinusitis, unspecified: Secondary | ICD-10-CM | POA: Diagnosis not present

## 2019-10-07 DIAGNOSIS — Z20828 Contact with and (suspected) exposure to other viral communicable diseases: Secondary | ICD-10-CM | POA: Diagnosis not present

## 2019-10-29 DIAGNOSIS — E1122 Type 2 diabetes mellitus with diabetic chronic kidney disease: Secondary | ICD-10-CM | POA: Diagnosis not present

## 2019-10-29 DIAGNOSIS — E785 Hyperlipidemia, unspecified: Secondary | ICD-10-CM | POA: Diagnosis not present

## 2019-10-29 DIAGNOSIS — I10 Essential (primary) hypertension: Secondary | ICD-10-CM | POA: Diagnosis not present

## 2019-10-29 DIAGNOSIS — Z0000 Encounter for general adult medical examination without abnormal findings: Secondary | ICD-10-CM | POA: Diagnosis not present

## 2019-10-29 DIAGNOSIS — N181 Chronic kidney disease, stage 1: Secondary | ICD-10-CM | POA: Diagnosis not present

## 2019-11-01 DIAGNOSIS — M65312 Trigger thumb, left thumb: Secondary | ICD-10-CM | POA: Diagnosis not present

## 2019-11-15 DIAGNOSIS — M25642 Stiffness of left hand, not elsewhere classified: Secondary | ICD-10-CM | POA: Diagnosis not present

## 2020-06-08 DIAGNOSIS — Z1159 Encounter for screening for other viral diseases: Secondary | ICD-10-CM | POA: Diagnosis not present

## 2020-06-14 DIAGNOSIS — D122 Benign neoplasm of ascending colon: Secondary | ICD-10-CM | POA: Diagnosis not present

## 2020-06-14 DIAGNOSIS — Z1211 Encounter for screening for malignant neoplasm of colon: Secondary | ICD-10-CM | POA: Diagnosis not present

## 2020-06-14 DIAGNOSIS — D128 Benign neoplasm of rectum: Secondary | ICD-10-CM | POA: Diagnosis not present

## 2020-06-14 DIAGNOSIS — D175 Benign lipomatous neoplasm of intra-abdominal organs: Secondary | ICD-10-CM | POA: Diagnosis not present

## 2020-06-14 DIAGNOSIS — K5730 Diverticulosis of large intestine without perforation or abscess without bleeding: Secondary | ICD-10-CM | POA: Diagnosis not present

## 2020-09-06 DIAGNOSIS — Z20822 Contact with and (suspected) exposure to covid-19: Secondary | ICD-10-CM | POA: Diagnosis not present

## 2020-09-07 DIAGNOSIS — J029 Acute pharyngitis, unspecified: Secondary | ICD-10-CM | POA: Diagnosis not present

## 2020-09-07 DIAGNOSIS — J324 Chronic pansinusitis: Secondary | ICD-10-CM | POA: Diagnosis not present

## 2020-12-31 IMAGING — CT CT HEAR MORPH WITH CTA COR WITH SCORE WITH CA WITH CONTRAST AND
4 of 7 series · 8 of 20 positions shown, 9 images · non-contrast
Comparison: None.
COMPARISON: None.

Addendum:
EXAM:
OVER-READ INTERPRETATION  CT CHEST

The following report is an over-read performed by radiologist Dr.
Jraidi Maya [REDACTED] on 01/27/2019. This over-read
does not include interpretation of cardiac or coronary anatomy or
pathology. The coronary CTA interpretation by the cardiologist is
attached.
CLINICAL DATA: 47-year-old female with h/o DM, hypertension,
hyperlipidemia and chest pain.
Cardiac/Coronary  CT
TECHNIQUE: The patient was scanned on a [employer] Force scanner.

[Series 6: best diast 75 % · axial · 0.39mm/px · z∈[+1126,+1161]mm · 2 of 263 slices shown, 3 images]
[im 88/263  vessel]
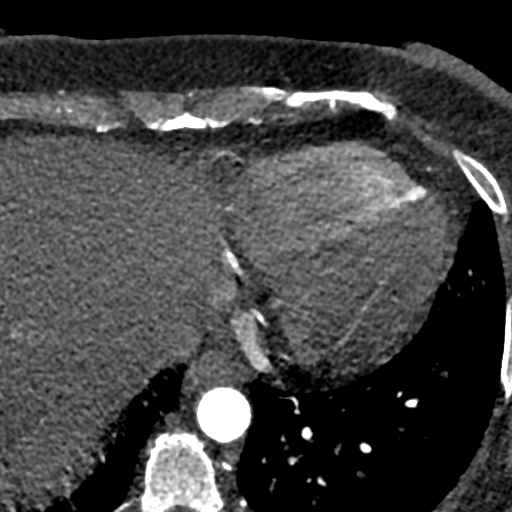
[im 88/263  lung]
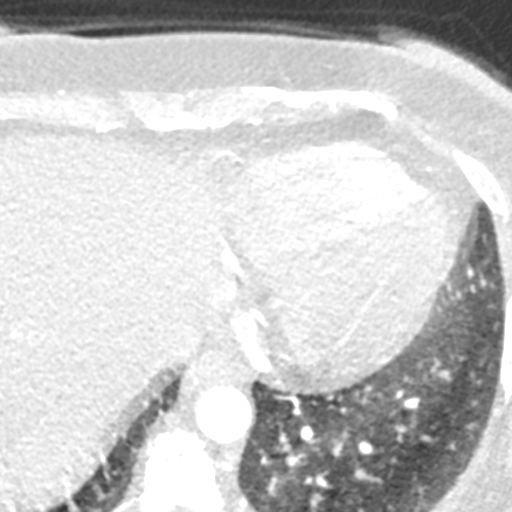
[im 175/263  vessel]
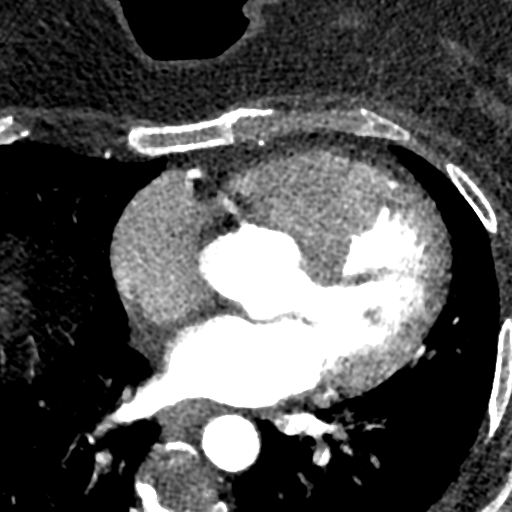

[Series 7: best syst 44 % · axial · 0.39mm/px · z∈[+1126,+1161]mm · 2 of 263 slices shown]
[im 88/263  vessel]
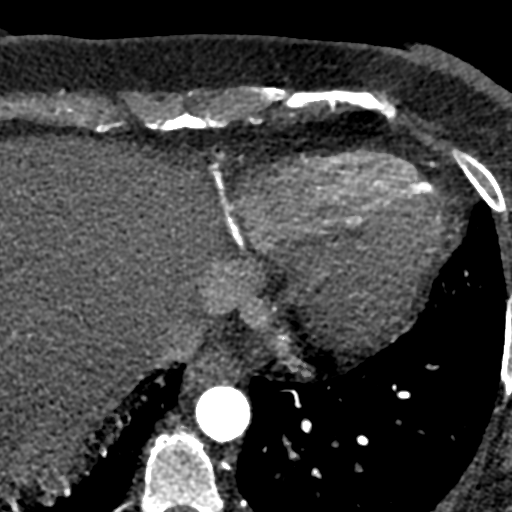
[im 175/263  vessel]
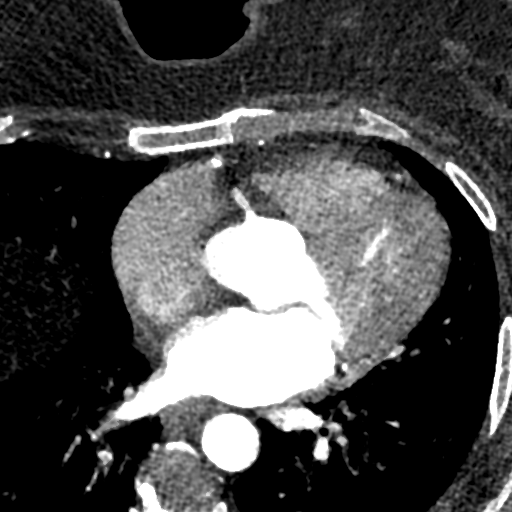

[Series 8: ts diast sharp 75 % · axial · 0.39mm/px · z∈[+1126,+1161]mm · 2 of 263 slices shown]
[im 88/263  lung]
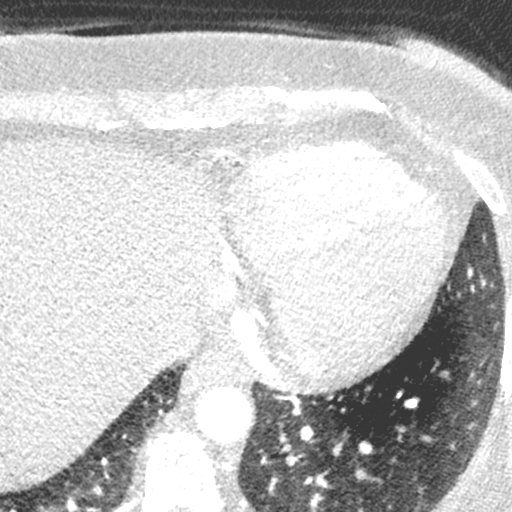
[im 175/263  lung]
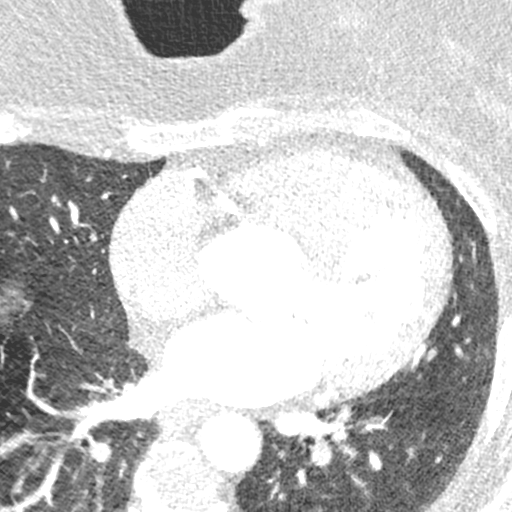

[Series 9: ts syst sharp 44 % · axial · 0.39mm/px · z∈[+1126,+1161]mm · 2 of 263 slices shown]
[im 88/263  lung]
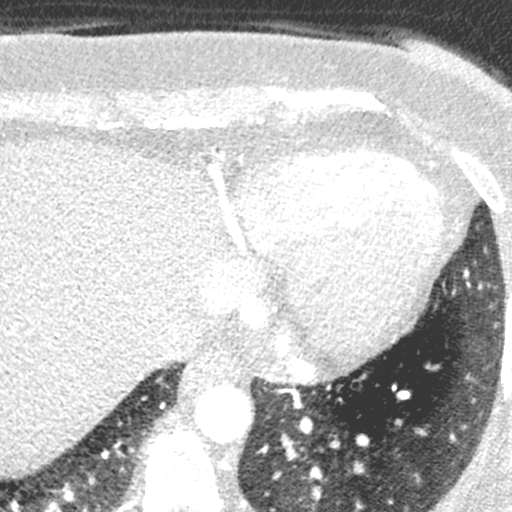
[im 175/263  lung]
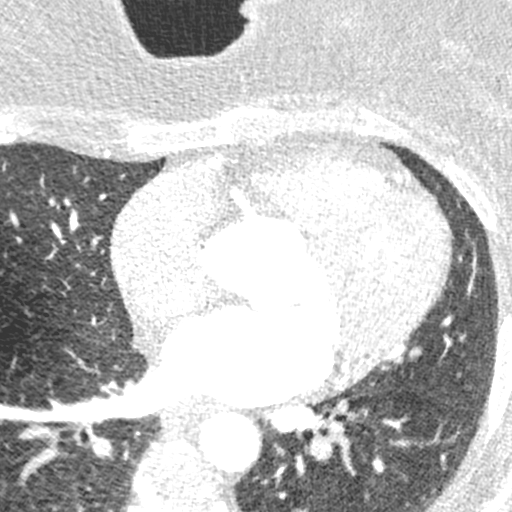

[8 of 20 positions shown; findings below may reference images not displayed]

FINDINGS: Vascular: Heart is normal size.  Visualized aorta normal caliber.

Mediastinum/Nodes: No adenopathy in the lower mediastinum or hila.

Lungs/Pleura: Visualized lungs clear.  No effusions.

Upper Abdomen: Imaging into the upper abdomen shows no acute
findings.

Musculoskeletal: Chest wall soft tissues are unremarkable. No acute
bony abnormality.
IMPRESSION: No acute or significant extracardiac abnormality.
FINDINGS: A 120 kV prospective scan was triggered in the descending thoracic
aorta at 111 HU's. Axial non-contrast 3 mm slices were carried out
through the heart. The data set was analyzed on a dedicated work
station and scored using the Agatson method. Gantry rotation speed
was 250 msecs and collimation was .6 mm. No beta blockade and 0.8 mg
of sl NTG was given. The 3D data set was reconstructed in 5%
intervals of the 67-82 % of the R-R cycle. Diastolic phases were
analyzed on a dedicated work station using MPR, MIP and VRT modes.
The patient received 80 cc of contrast.

Aorta: Normal size. Minimal atherosclerotic plaque. No dissection.

Aortic Valve:  Trileaflet.  No calcifications.

Coronary Arteries:  Normal coronary origin.  Right dominance.

The study quality is affected by patient's size and motion.

RCA is a large dominant artery, there is minimal plaque in the
proximal and mid portion, distal RCA/PDA/PLA are not visualized.

Left main is a large artery that gives rise to LAD and LCX arteries.

LAD is a medium caliber vessel that gives rise to one small diagonal
artery. There is no plaque in the proximal and mid portion.

LCX is a non-dominant artery that gives rise to one OM1 branch.
There is no obvious plaque.

Other findings:

Normal pulmonary vein drainage into the left atrium.

Normal let atrial appendage without a thrombus.

Normal size of the pulmonary artery.
IMPRESSION: 1. Coronary calcium score of 0. This was 0 percentile for age and
sex matched control.

2. Normal coronary origin with right dominance.

3. The study quality is affected by patient's size and motion.
However, there is no plaque in the left main and proximal and mid
portion of the coronary arteries.

*** End of Addendum ***
EXAM:
OVER-READ INTERPRETATION  CT CHEST

The following report is an over-read performed by radiologist Dr.
Jraidi Maya [REDACTED] on 01/27/2019. This over-read
does not include interpretation of cardiac or coronary anatomy or
pathology. The coronary CTA interpretation by the cardiologist is
attached.
FINDINGS: Vascular: Heart is normal size.  Visualized aorta normal caliber.

Mediastinum/Nodes: No adenopathy in the lower mediastinum or hila.

Lungs/Pleura: Visualized lungs clear.  No effusions.

Upper Abdomen: Imaging into the upper abdomen shows no acute
findings.

Musculoskeletal: Chest wall soft tissues are unremarkable. No acute
bony abnormality.
IMPRESSION: No acute or significant extracardiac abnormality.

## 2021-03-04 ENCOUNTER — Emergency Department (HOSPITAL_COMMUNITY)
Admission: EM | Admit: 2021-03-04 | Discharge: 2021-03-04 | Disposition: A | Discharge status: Home / Self Care | Payer: BC Managed Care – PPO | Attending: Emergency Medicine | Admitting: Emergency Medicine

## 2021-03-04 ENCOUNTER — Encounter (HOSPITAL_COMMUNITY): Payer: Self-pay | Admitting: Emergency Medicine

## 2021-03-04 ENCOUNTER — Other Ambulatory Visit: Payer: Self-pay

## 2021-03-04 DIAGNOSIS — T50904A Poisoning by unspecified drugs, medicaments and biological substances, undetermined, initial encounter: Secondary | ICD-10-CM | POA: Diagnosis not present

## 2021-03-04 DIAGNOSIS — Z87891 Personal history of nicotine dependence: Secondary | ICD-10-CM | POA: Diagnosis not present

## 2021-03-04 DIAGNOSIS — Z20822 Contact with and (suspected) exposure to covid-19: Secondary | ICD-10-CM | POA: Insufficient documentation

## 2021-03-04 DIAGNOSIS — T450X1A Poisoning by antiallergic and antiemetic drugs, accidental (unintentional), initial encounter: Secondary | ICD-10-CM | POA: Diagnosis not present

## 2021-03-04 DIAGNOSIS — E119 Type 2 diabetes mellitus without complications: Secondary | ICD-10-CM | POA: Diagnosis not present

## 2021-03-04 DIAGNOSIS — R000 Tachycardia, unspecified: Secondary | ICD-10-CM | POA: Diagnosis not present

## 2021-03-04 DIAGNOSIS — T450X2A Poisoning by antiallergic and antiemetic drugs, intentional self-harm, initial encounter: Secondary | ICD-10-CM | POA: Insufficient documentation

## 2021-03-04 DIAGNOSIS — T887XXA Unspecified adverse effect of drug or medicament, initial encounter: Secondary | ICD-10-CM | POA: Diagnosis not present

## 2021-03-04 DIAGNOSIS — Z79899 Other long term (current) drug therapy: Secondary | ICD-10-CM | POA: Insufficient documentation

## 2021-03-04 DIAGNOSIS — F29 Unspecified psychosis not due to a substance or known physiological condition: Secondary | ICD-10-CM | POA: Diagnosis not present

## 2021-03-04 LAB — URINALYSIS, ROUTINE W REFLEX MICROSCOPIC
Bacteria, UA: NONE SEEN
Bilirubin Urine: NEGATIVE
Glucose, UA: >=500 mg/dL — AB
Hgb urine dipstick: NEGATIVE
Ketones, ur: 20 mg/dL — AB
Leukocytes,Ua: NEGATIVE
Nitrite: NEGATIVE
Protein, ur: NEGATIVE mg/dL
Specific Gravity, Urine: 1.006 (ref 1.005–1.030)
pH: 7 (ref 5.0–8.0)

## 2021-03-04 LAB — BLOOD GAS, VENOUS
Acid-Base Excess: 0.6 mmol/L (ref 0.0–2.0)
Bicarbonate: 25.5 mmol/L (ref 20.0–28.0)
FIO2: 21
O2 Saturation: 76.1 %
Patient temperature: 98.6
pCO2, Ven: 43.9 mmHg — ABNORMAL LOW (ref 44.0–60.0)
pH, Ven: 7.382 (ref 7.250–7.430)
pO2, Ven: 43.5 mmHg (ref 32.0–45.0)

## 2021-03-04 LAB — COMPREHENSIVE METABOLIC PANEL
ALT: 26 U/L (ref 0–44)
AST: 27 U/L (ref 15–41)
Albumin: 4.2 g/dL (ref 3.5–5.0)
Alkaline Phosphatase: 62 U/L (ref 38–126)
Anion gap: 10 (ref 5–15)
BUN: 8 mg/dL (ref 6–20)
CO2: 23 mmol/L (ref 22–32)
Calcium: 9 mg/dL (ref 8.9–10.3)
Chloride: 105 mmol/L (ref 98–111)
Creatinine, Ser: 0.51 mg/dL (ref 0.44–1.00)
GFR, Estimated: >60 mL/min (ref >60)
Glucose, Bld: 230 mg/dL — ABNORMAL HIGH (ref 70–99)
Potassium: 4 mmol/L (ref 3.5–5.1)
Sodium: 138 mmol/L (ref 135–145)
Total Bilirubin: 0.9 mg/dL (ref 0.3–1.2)
Total Protein: 7.3 g/dL (ref 6.5–8.1)

## 2021-03-04 LAB — RAPID URINE DRUG SCREEN, HOSP PERFORMED
Amphetamines: NOT DETECTED
Barbiturates: NOT DETECTED
Benzodiazepines: NOT DETECTED
Cocaine: NOT DETECTED
Opiates: NOT DETECTED
Tetrahydrocannabinol: NOT DETECTED

## 2021-03-04 LAB — RESP PANEL BY RT-PCR (FLU A&B, COVID) ARPGX2
Influenza A by PCR: NEGATIVE
Influenza B by PCR: NEGATIVE
SARS Coronavirus 2 by RT PCR: NEGATIVE

## 2021-03-04 LAB — PREGNANCY, URINE: Preg Test, Ur: NEGATIVE

## 2021-03-04 LAB — SALICYLATE LEVEL: Salicylate Lvl: <7 mg/dL — ABNORMAL LOW (ref 7.0–30.0)

## 2021-03-04 LAB — ETHANOL: Alcohol, Ethyl (B): <10 mg/dL (ref <10)

## 2021-03-04 LAB — ACETAMINOPHEN LEVEL: Acetaminophen (Tylenol), Serum: <10 ug/mL — ABNORMAL LOW (ref 10–30)

## 2021-03-04 MED ORDER — SODIUM CHLORIDE 0.9 % IV BOLUS
500.0000 mL | Freq: Once | INTRAVENOUS | Status: AC
Start: 2021-03-04 — End: 2021-03-04
  Administered 2021-03-04: 500 mL via INTRAVENOUS

## 2021-03-04 NOTE — ED Triage Notes (Signed)
Pt BIBA from home. EMS reports pt got in an argument with her husband. In an attempt to commit suicide pt took 15 25 mg tablets of benadryl. No other previous suicide attempts in the past. Pt reports just feeling tired, no other complaints at this time   HR-120 BP-150/90 RR-18 98% room air CBG 296

## 2021-03-04 NOTE — ED Provider Notes (Signed)
[facility] DEPT Provider Note   CSN: [id number] Arrival date & time: 03/04/21  1432     History Chief Complaint  Patient presents with   Drug Overdose    Natalie Kennedy is a 50 y.o. female.  HPI She presents for evaluation of intentional overdose with Benadryl, after an argument with her husband.  She states she was upset about him showing her a picture of another woman.  Patient apparently told her daughter, that she overdosed, whereupon the daughter called the husband, and he apparently called EMS.  She was transferred here by EMS without incident or treatment.  She has difficulty giving history because of continually falling asleep.  Level 5 caveat-altered mental status    Past Medical History:  Diagnosis Date   Abnormal Pap smear of cervix    Anxiety    CKD (chronic kidney disease)    1   Depression    Diabetes mellitus    Diverticulitis    Hyperlipidemia    Hypertension    [facility]   Hypothyroidism    Pseudotumor cerebri    diagnosed with pseudotumor cerebri and treated with lumbar punctures but evaluation by Dr Jannifer Franklin in 2012 indicated migraine headaches   Thyroid disease     Patient Active Problem List   Diagnosis Date Noted   Angina pectoris ([place]) 10/21/2018   Diabetes mellitus ([place]) 10/21/2018   Hyperlipidemia 08/20/2018   Hypothyroidism 08/20/2018    Past Surgical History:  Procedure Laterality Date   CERVIX SURGERY     surgery on cervix for abnormal pap 2009   MASTOIDECTOMY  [address] and 1982   mastoid surgery    [place]     OB History   No obstetric history on file.     Family History  Problem Relation Age of Onset   Diabetes Mother    Hypertension Mother    Heart attack Mother        MI (63)   CAD Mother    Diabetes Maternal Grandfather    Lung cancer Maternal Grandfather    Heart attack Paternal Grandmother        MI (40)   Diabetes Other    Diabetes Maternal Uncle     CAD Maternal Grandmother    Diabetes Maternal Grandmother     Social History   Tobacco Use   Smoking status: Former    Packs/day: 1.00    Years: 10.00    Pack years: 10.00    Types: Cigarettes    Quit date: 2007    Years since quitting: 15.5   Smokeless tobacco: Never  Vaping Use   Vaping Use: Never used  Substance Use Topics   Alcohol use: Yes    Comment: ocassionally   Drug use: No    Home Medications Prior to Admission medications   Medication Sig Start Date End Date Taking? Authorizing Provider  APPLE CIDER VINEGAR PO Take 2 tablets by mouth daily.    [provider]  aspirin EC 81 MG tablet Take 1 tablet (81 mg total) by mouth daily. 10/23/18   Richardo Priest, MD  atorvastatin (LIPITOR) 20 MG tablet Take 20 mg by mouth 2 (two) times a week.     [provider]  diltiazem (CARDIZEM CD) 240 MG 24 hr capsule Take 1 capsule (240 mg total) by mouth daily. 10/23/18 01/21/19  Richardo Priest, MD  insulin detemir (LEVEMIR) 100 UNIT/ML injection Inject 40 Units into the  skin at bedtime.     [provider]  levothyroxine (SYNTHROID, LEVOTHROID) 88 MCG tablet Take 88 mcg by mouth daily.      [provider]  losartan (COZAAR) 25 MG tablet Take 25 mg by mouth 2 (two) times a week.     [provider]  metoprolol tartrate (LOPRESSOR) 50 MG tablet Take 1 tablet (50 mg total) by mouth once for 1 dose. Please see directions regarding Cardiac CTA instructions. 10/23/18 10/23/18  Richardo Priest, MD  Multiple Vitamin (MULTIVITAMIN) tablet Take 1 tablet by mouth daily.    [provider]  nitroGLYCERIN (NITROSTAT) 0.4 MG SL tablet Place 1 tablet (0.4 mg total) under the tongue every 5 (five) minutes as needed for chest pain. 10/23/18 01/21/19  Richardo Priest, MD  Probiotic Product ([place]) CAPS Take 1 capsule by mouth daily.    [provider]  Saxagliptin-Metformin (KOMBIGLYZE XR) 12-998 MG TB24 Take 1 tablet by mouth  daily.    [provider]    Allergies    Lisinopril  Review of Systems   Review of Systems  Unable to perform ROS: Mental status change   Physical Exam Updated Vital Signs BP (!) 160/101   Pulse (!) 114   Temp 97.8 F (36.6 C) (Oral)   Resp 16   Ht 5\' 3"  (1.6 m)   Wt 87.5 kg   SpO2 99%   BMI 34.17 kg/m   Physical Exam Vitals and nursing note reviewed.  Constitutional:      General: She is in acute distress.     Appearance: She is well-developed. She is ill-appearing. She is not toxic-appearing or diaphoretic.  HENT:     Head: Normocephalic and atraumatic.     Right Ear: External ear normal.     Left Ear: External ear normal.     Nose: No congestion or rhinorrhea.     Mouth/Throat:     Mouth: Mucous membranes are dry.     Pharynx: No oropharyngeal exudate or posterior oropharyngeal erythema.  Eyes:     Conjunctiva/sclera: Conjunctivae normal.     Pupils: Pupils are equal, round, and reactive to light.  Neck:     Trachea: Phonation normal.  Cardiovascular:     Rate and Rhythm: Normal rate and regular rhythm.     Heart sounds: Normal heart sounds.  Pulmonary:     Effort: Pulmonary effort is normal.     Breath sounds: Normal breath sounds.  Abdominal:     General: There is no distension.     Palpations: Abdomen is soft.     Tenderness: There is no abdominal tenderness. There is no guarding.  Musculoskeletal:        General: Normal range of motion.     Cervical back: Normal range of motion and neck supple.  Skin:    General: Skin is warm and dry.  Neurological:     Mental Status: She is alert.     Cranial Nerves: No cranial nerve deficit.     Sensory: No sensory deficit.     Motor: No abnormal muscle tone.     Coordination: Coordination normal.     Comments: Groggy.  Dysarthric.  Responsive.  Follows commands accurately.  Psychiatric:        Attention and Perception: She is inattentive.        Mood and Affect: Mood is depressed.        Speech:  Speech is delayed.  Behavior: Behavior is slowed. Behavior is not agitated, aggressive or combative.        Thought Content: Thought content is not paranoid. Thought content includes suicidal ideation. Thought content does not include homicidal ideation. Thought content includes suicidal plan.        Cognition and Memory: Cognition is impaired.        Judgment: Judgment is impulsive.    ED Results / Procedures / Treatments   Labs (all labs ordered are listed, but only abnormal results are displayed) Labs Reviewed  COMPREHENSIVE METABOLIC PANEL - Abnormal; Notable for the following components:      Result Value   Glucose, Bld 230 (*)    All other components within normal limits  BLOOD GAS, VENOUS - Abnormal; Notable for the following components:   pCO2, Ven 43.9 (*)    All other components within normal limits  SALICYLATE LEVEL - Abnormal; Notable for the following components:   Salicylate Lvl <0.7 (*)    All other components within normal limits  ACETAMINOPHEN LEVEL - Abnormal; Notable for the following components:   Acetaminophen (Tylenol), Serum <10 (*)    All other components within normal limits  URINALYSIS, ROUTINE W REFLEX MICROSCOPIC - Abnormal; Notable for the following components:   Color, Urine STRAW (*)    Glucose, UA >=500 (*)    Ketones, ur 20 (*)    All other components within normal limits  RESP PANEL BY RT-PCR (FLU A&B, COVID) ARPGX2  ETHANOL  RAPID URINE DRUG SCREEN, HOSP PERFORMED  PREGNANCY, URINE    EKG EKG Interpretation  Date/Time:  Sunday March 04 2021 14:54:57 EDT Ventricular Rate:  124 PR Interval:  128 QRS Duration: 79 QT Interval:  370 QTC Calculation: 532 R Axis:   110 Text Interpretation: Sinus tachycardia Left posterior fascicular block Low voltage, precordial leads Consider anterior infarct Borderline T abnormalities, inferior leads Prolonged QT interval Since last tracing QT has lengthened Otherwise no significant change Confirmed by  Daleen Bo [phone]) on 03/04/2021 4:06:29 PM  Radiology No results found.  Procedures .Critical Care  Date/Time: 03/04/2021 8:10 PM Performed by: Daleen Bo, MD Authorized by: Daleen Bo, MD   Critical care provider statement:    Critical care time (minutes):  35   Critical care start time:  03/04/2021 2:41 PM   Critical care end time:  03/04/2021 8:10 PM   Critical care time was exclusive of:  Separately billable procedures and treating other patients   Critical care was necessary to treat or prevent imminent or life-threatening deterioration of the following conditions:  Toxidrome   Critical care was time spent personally by me on the following activities:  Blood draw for specimens, development of treatment plan with patient or surrogate, discussions with consultants, evaluation of patient's response to treatment, examination of patient, obtaining history from patient or surrogate, ordering and performing treatments and interventions, ordering and review of laboratory studies, pulse oximetry, re-evaluation of patient's condition, review of old charts and ordering and review of radiographic studies   Medications Ordered in ED Medications  sodium chloride 0.9 % bolus 500 mL (500 mLs Intravenous Bolus 03/04/21 1542)    ED Course  I have reviewed the triage vital signs and the nursing notes.  Pertinent labs & imaging results that were available during my care of the patient were reviewed by me and considered in my medical decision making (see chart for details).  Clinical Course as of 03/04/21 2017  Nancy Fetter Mar 04, 2021  1620 Case discussed with poison  control.  Currently patient's QRS is normal.  QTC is mildly prolonged from baseline possibly an effect from the Benadryl.  Patient will continue to be monitored, for total of 6 hours from point of ingestion, and until able to communicate with behavioral health team. [EW]  1620 Initial screening labs are reassuring.  Patient continues  to be mildly tachycardic and hypertensive. [EW]  2009 Patient was able ambulate easily to the bathroom.  She was able to urinate and empty her bladder. [EW]    Clinical Course User Index [EW] Daleen Bo, MD   MDM Rules/Calculators/A&P                           Patient Vitals for the past 24 hrs:  BP Temp Temp src Pulse Resp SpO2 Height Weight  03/04/21 2000 (!) 160/101 -- -- (!) 114 16 99 % -- --  03/04/21 1930 (!) 153/97 -- -- (!) 110 (!) 21 97 % -- --  03/04/21 1900 (!) 156/97 -- -- (!) 112 (!) 22 96 % -- --  03/04/21 1830 (!) 160/104 -- -- (!) 116 17 100 % -- --  03/04/21 1815 (!) 159/95 -- -- -- (!) 21 99 % -- --  03/04/21 1745 (!) 161/95 -- -- (!) 111 (!) 23 99 % -- --  03/04/21 1730 (!) 154/99 -- -- (!) 115 (!) 23 99 % -- --  03/04/21 1715 (!) 162/89 -- -- (!) 109 17 98 % -- --  03/04/21 1645 (!) 152/103 -- -- (!) 108 (!) 28 97 % -- --  03/04/21 1615 (!) 154/102 -- -- (!) 110 (!) 22 99 % -- --  03/04/21 1600 (!) 157/98 -- -- (!) 109 (!) 25 98 % -- --  03/04/21 1530 (!) 149/97 -- -- (!) 114 (!) 26 97 % -- --  03/04/21 1500 (!) 169/96 -- -- (!) 126 (!) 28 96 % -- --  03/04/21 1458 -- -- -- -- -- -- 5\' 3"  (1.6 m) 87.5 kg  03/04/21 1457 (!) 190/96 97.8 F (36.6 C) Oral (!) 120 (!) 22 96 % -- --  03/04/21 1443 -- -- -- -- -- 98 % -- --    8:07 PM Reevaluation with update and discussion. After initial assessment and treatment, an updated evaluation reveals patient is wide-awake alert and able to give history.  She is no longer dysarthric.  She states that she took the Benadryl tablets, 15, because she was mad at her husband.  She was upset that he had used illegal drugs, last night.  This is a recurrent problem.  The patient does not use illegal drugs.  Since the ingestion, the patient's husband has left to go to [place] to his usual job.  The patient's mother has been in communication with her husband, and apparently he is not planning on coming back anytime soon.  The patient  feels that the argument today with her husband, triggered her impulsive act of taking the Benadryl.  She states that she will not harm herself again.  She is committed to following up with a therapist and will obtain that by calling her helpline at work.  Findings discussed with the patient, and her mother who was at the bedside.  I know her mother Joaquim Nam) well.  All questions were answered to the satisfaction of patient and mother. Daleen Bo   Medical Decision Making:  This patient is presenting for evaluation of intentional overdose with diphenhydramine, which does  require a range of treatment options, and is a complaint that involves a high risk of morbidity and mortality. The differential diagnoses include toxic overdose, hemodynamic instability, intoxication with other agents, metabolic disorder, suicide intent. I decided to review old records, and in summary millages female with history of cardiac angina, diabetes and hyperlipidemia.  She apparently does not have a prior psychiatric history.  I obtained additional historical information from the patient's mother at the bedside, at 8:00 PM.  Her mother states that she is not concerned about the patient harming herself or other and that she will assist the patient to get follow-up care with a therapist.  Clinical Laboratory Tests Ordered, included CBC, Metabolic panel, Urinalysis, and alcohol level, urine drug screen, Tylenol level, aspirin level, viral panel . Review indicates normal except glucose in urine and serum.   Cardiac Monitor Tracing which shows sinus tachycardia   Critical Interventions-clinical evaluation, laboratory testing, cardiac monitoring, IV fluids, discussion with poison control, observation and reassessment  After These Interventions, the Patient was reevaluated and was found with significant diphenhydramine overdose, at risk for anti-cholinergic syndrome.  Observation ED required for 6 hours.  Patient reevaluated  at 8:00 PM, and is alert and cooperative.  She states that she impulsively took the Benadryl and does not have any plans to do that again.  She has mild persistent tachycardia and hypertension.  She is unsure if she has all of her prescribed blood pressure medicine at home.  Doubt hypertensive urgency.  Doubt injury from overdose.  The patient is at very low risk, and is being discharged with a supportive mother, and a plan for her to follow-up with a therapist.  The patient was able to void, prior to leaving the emergency department.  CRITICAL CARE- yes Performed by: Daleen Bo  Nursing Notes Reviewed/ Care Coordinated Applicable Imaging Reviewed Interpretation of Laboratory Data incorporated into ED treatment  The patient appears reasonably screened and/or stabilized for discharge and I doubt any other medical condition or other [facility] requiring further screening, evaluation, or treatment in the ED at this time prior to discharge.  Plan: Home Medications-continue usual; Home Treatments-low-salt diet, regular activity; return here if the recommended treatment, does not improve the symptoms; Recommended follow up-PCP checkup for blood pressure 1 to 2 weeks.  Consider starting antidepressant medication.  Follow-up with your therapist as soon as possible, to discuss stressors.     Final Clinical Impression(s) / ED Diagnoses Final diagnoses:  Intentional diphenhydramine overdose, initial encounter [facility])    Rx / [place] Orders ED Discharge Orders     None        Daleen Bo, MD 03/04/21 2017

## 2021-03-04 NOTE — Discharge Instructions (Addendum)
It is important for you to connect with a therapist for discussion of stress management.  Use your work helpline to find a therapist.  See your PCP in 1 or 2 weeks for checkup, blood pressure evaluation, and consideration of starting antidepressant medication.  Return here, if needed for problems.

## 2021-03-04 NOTE — ED Notes (Signed)
Pt ambulated to restroom with steady gait and voided without difficulty.

## 2021-03-12 DIAGNOSIS — Z1231 Encounter for screening mammogram for malignant neoplasm of breast: Secondary | ICD-10-CM | POA: Diagnosis not present

## 2021-03-12 DIAGNOSIS — E8941 Symptomatic postprocedural ovarian failure: Secondary | ICD-10-CM | POA: Diagnosis not present

## 2021-03-12 DIAGNOSIS — Z01419 Encounter for gynecological examination (general) (routine) without abnormal findings: Secondary | ICD-10-CM | POA: Diagnosis not present

## 2021-03-12 DIAGNOSIS — Z6834 Body mass index (BMI) 34.0-34.9, adult: Secondary | ICD-10-CM | POA: Diagnosis not present

## 2021-03-28 DIAGNOSIS — E785 Hyperlipidemia, unspecified: Secondary | ICD-10-CM | POA: Diagnosis not present

## 2021-03-28 DIAGNOSIS — I10 Essential (primary) hypertension: Secondary | ICD-10-CM | POA: Diagnosis not present

## 2021-03-28 DIAGNOSIS — Z7984 Long term (current) use of oral hypoglycemic drugs: Secondary | ICD-10-CM | POA: Diagnosis not present

## 2021-03-28 DIAGNOSIS — E1122 Type 2 diabetes mellitus with diabetic chronic kidney disease: Secondary | ICD-10-CM | POA: Diagnosis not present

## 2021-03-28 DIAGNOSIS — Z23 Encounter for immunization: Secondary | ICD-10-CM | POA: Diagnosis not present

## 2021-03-28 DIAGNOSIS — N181 Chronic kidney disease, stage 1: Secondary | ICD-10-CM | POA: Diagnosis not present

## 2021-03-28 DIAGNOSIS — E039 Hypothyroidism, unspecified: Secondary | ICD-10-CM | POA: Diagnosis not present

## 2021-03-28 DIAGNOSIS — Z0000 Encounter for general adult medical examination without abnormal findings: Secondary | ICD-10-CM | POA: Diagnosis not present

## 2021-04-25 DIAGNOSIS — F411 Generalized anxiety disorder: Secondary | ICD-10-CM | POA: Diagnosis not present

## 2021-09-28 DIAGNOSIS — I10 Essential (primary) hypertension: Secondary | ICD-10-CM | POA: Diagnosis not present

## 2021-09-28 DIAGNOSIS — E1169 Type 2 diabetes mellitus with other specified complication: Secondary | ICD-10-CM | POA: Diagnosis not present

## 2021-09-28 DIAGNOSIS — Z7984 Long term (current) use of oral hypoglycemic drugs: Secondary | ICD-10-CM | POA: Diagnosis not present

## 2021-09-28 DIAGNOSIS — E039 Hypothyroidism, unspecified: Secondary | ICD-10-CM | POA: Diagnosis not present

## 2021-09-28 DIAGNOSIS — E785 Hyperlipidemia, unspecified: Secondary | ICD-10-CM | POA: Diagnosis not present

## 2022-02-18 DIAGNOSIS — M79645 Pain in left finger(s): Secondary | ICD-10-CM | POA: Diagnosis not present

## 2022-02-18 DIAGNOSIS — Z4789 Encounter for other orthopedic aftercare: Secondary | ICD-10-CM | POA: Diagnosis not present

## 2022-02-18 DIAGNOSIS — M25532 Pain in left wrist: Secondary | ICD-10-CM | POA: Diagnosis not present

## 2022-02-18 DIAGNOSIS — M654 Radial styloid tenosynovitis [de Quervain]: Secondary | ICD-10-CM | POA: Diagnosis not present

## 2022-04-17 DIAGNOSIS — M25532 Pain in left wrist: Secondary | ICD-10-CM | POA: Diagnosis not present

## 2022-04-17 DIAGNOSIS — M654 Radial styloid tenosynovitis [de Quervain]: Secondary | ICD-10-CM | POA: Diagnosis not present

## 2022-04-23 DIAGNOSIS — I10 Essential (primary) hypertension: Secondary | ICD-10-CM | POA: Diagnosis not present

## 2022-04-23 DIAGNOSIS — Z0000 Encounter for general adult medical examination without abnormal findings: Secondary | ICD-10-CM | POA: Diagnosis not present

## 2022-04-23 DIAGNOSIS — E1169 Type 2 diabetes mellitus with other specified complication: Secondary | ICD-10-CM | POA: Diagnosis not present

## 2022-04-23 DIAGNOSIS — E785 Hyperlipidemia, unspecified: Secondary | ICD-10-CM | POA: Diagnosis not present

## 2022-04-23 DIAGNOSIS — E039 Hypothyroidism, unspecified: Secondary | ICD-10-CM | POA: Diagnosis not present

## 2022-05-19 ENCOUNTER — Ambulatory Visit
Admission: EM | Admit: 2022-05-19 | Discharge: 2022-05-19 | Disposition: A | Discharge status: Home / Self Care | Payer: BC Managed Care – PPO | Attending: Internal Medicine | Admitting: Internal Medicine

## 2022-05-19 DIAGNOSIS — Z20822 Contact with and (suspected) exposure to covid-19: Secondary | ICD-10-CM | POA: Insufficient documentation

## 2022-05-19 DIAGNOSIS — J069 Acute upper respiratory infection, unspecified: Secondary | ICD-10-CM | POA: Diagnosis not present

## 2022-05-19 LAB — SARS CORONAVIRUS 2 (TAT 6-24 HRS): SARS Coronavirus 2: NEGATIVE

## 2022-05-19 MED ORDER — CETIRIZINE HCL 10 MG PO TABS: 10.0000 mg | ORAL_TABLET | Freq: Every day | ORAL | 0 refills | Status: AC

## 2022-05-19 MED ORDER — GUAIFENESIN 200 MG PO TABS: 200.0000 mg | ORAL_TABLET | ORAL | 0 refills | Status: AC | PRN

## 2022-05-19 MED ORDER — FLUTICASONE PROPIONATE 50 MCG/ACT NA SUSP: 1.0000 | Freq: Every day | NASAL | 0 refills | Status: AC

## 2022-05-19 NOTE — ED Triage Notes (Signed)
Pt presents to [facility] with of sinus congestion and chest congestion, cough for one week. Recent traveling to [place] and sore throat starting this morning. Pt has been taking sinus medication for symptoms.

## 2022-05-19 NOTE — ED Provider Notes (Signed)
[facility]    CSN: [id number] Arrival date & time: 05/19/22  0160      History   Chief Complaint Chief Complaint  Patient presents with   Facial Pain    HPI Natalie Kennedy is a 51 y.o. female.   Patient presents with nasal congestion, feelings of chest congestion, sore throat, nonproductive cough that has been present for for about 5 days.  Denies any known fevers.  Denies any known sick contacts but does report that she recently traveled to [place].  Denies chest pain, shortness of breath, ear pain, nausea, vomiting, diarrhea, abdominal pain.  Has been taking over-the-counter cold and flu medications with little improvement in symptoms.  Denies history of asthma or COPD.     Past Medical History:  Diagnosis Date   Abnormal Pap smear of cervix    Anxiety    CKD (chronic kidney disease)    1   Depression    Diabetes mellitus    Diverticulitis    Hyperlipidemia    Hypertension    [facility]   Hypothyroidism    Pseudotumor cerebri    diagnosed with pseudotumor cerebri and treated with lumbar punctures but evaluation by Dr Jannifer Franklin in 2012 indicated migraine headaches   Thyroid disease     Patient Active Problem List   Diagnosis Date Noted   Angina pectoris ([place]) 10/21/2018   Diabetes mellitus ([place]) 10/21/2018   Hyperlipidemia 08/20/2018   Hypothyroidism 08/20/2018    Past Surgical History:  Procedure Laterality Date   CERVIX SURGERY     surgery on cervix for abnormal pap 2009   MASTOIDECTOMY  [address]11 and 1982   mastoid surgery    Opal    OB History   No obstetric history on file.      Home Medications    Prior to Admission medications   Medication Sig Start Date End Date Taking? Authorizing Provider  cetirizine (ZYRTEC) 10 MG tablet Take 1 tablet (10 mg total) by mouth daily. 05/19/22  Yes Danea Manter, Michele Rockers, FNP  fluticasone (FLONASE) 50 MCG/ACT nasal spray Place 1 spray into both nostrils daily for 3 days.  05/19/22 05/22/22 Yes Grecia Lynk, Michele Rockers, FNP  guaiFENesin 200 MG tablet Take 1 tablet (200 mg total) by mouth every 4 (four) hours as needed for cough or to loosen phlegm. 05/19/22  Yes Amira Podolak, Michele Rockers, FNP  APPLE CIDER VINEGAR PO Take 2 tablets by mouth daily.    [provider]  aspirin EC 81 MG tablet Take 1 tablet (81 mg total) by mouth daily. 10/23/18   Richardo Priest, MD  atorvastatin (LIPITOR) 20 MG tablet Take 20 mg by mouth 2 (two) times a week.     [provider]  diltiazem (CARDIZEM CD) 240 MG 24 hr capsule Take 1 capsule (240 mg total) by mouth daily. 10/23/18 01/21/19  Richardo Priest, MD  insulin detemir (LEVEMIR) 100 UNIT/ML injection Inject 40 Units into the skin at bedtime.     [provider]  levothyroxine (SYNTHROID, LEVOTHROID) 88 MCG tablet Take 88 mcg by mouth daily.      [provider]  losartan (COZAAR) 25 MG tablet Take 25 mg by mouth 2 (two) times a week.     [provider]  metoprolol tartrate (LOPRESSOR) 50 MG tablet Take 1 tablet (50 mg total) by mouth once for 1 dose. Please see directions regarding Cardiac CTA instructions. Patient not taking: Reported on 05/19/2022 10/23/18 10/23/18  Richardo Priest, MD  Multiple Vitamin (MULTIVITAMIN) tablet Take 1 tablet by mouth daily.    [provider]  nitroGLYCERIN (NITROSTAT) 0.4 MG SL tablet Place 1 tablet (0.4 mg total) under the tongue every 5 (five) minutes as needed for chest pain. 10/23/18 01/21/19  Richardo Priest, MD  Probiotic Product ([place]) CAPS Take 1 capsule by mouth daily.    [provider]  Saxagliptin-Metformin (KOMBIGLYZE XR) 12-998 MG TB24 Take 1 tablet by mouth daily.    [provider]    Family History Family History  Problem Relation Age of Onset   Diabetes Mother    Hypertension Mother    Heart attack Mother        MI (39)   CAD Mother    Diabetes Maternal Grandfather    Lung cancer Maternal Grandfather    Heart  attack Paternal Grandmother        MI (76)   Diabetes Other    Diabetes Maternal Uncle    CAD Maternal Grandmother    Diabetes Maternal Grandmother     Social History Social History   Tobacco Use   Smoking status: Former    Packs/day: 1.00    Years: 10.00    Total pack years: 10.00    Types: Cigarettes    Quit date: 2007    Years since quitting: 16.7   Smokeless tobacco: Never  Vaping Use   Vaping Use: Never used  Substance Use Topics   Alcohol use: Yes    Comment: ocassionally   Drug use: No     Allergies   Lisinopril   Review of Systems Review of Systems Per HPI  Physical Exam Triage Vital Signs ED Triage Vitals  Enc Vitals Group     BP 05/19/22 1012 126/84     Pulse Rate 05/19/22 1012 80     Resp 05/19/22 1012 20     Temp 05/19/22 1012 97.7 F (36.5 C)     Temp src --      SpO2 05/19/22 1012 99 %     Weight --      Height --      Head Circumference --      Peak Flow --      Pain Score 05/19/22 1011 0     Pain Loc --      Pain Edu? --      Excl. in [place]? --    No data found.  Updated Vital Signs BP 126/84   Pulse 80   Temp 97.7 F (36.5 C)   Resp 20   SpO2 99%   Visual Acuity Right Eye Distance:   Left Eye Distance:   Bilateral Distance:    Right Eye Near:   Left Eye Near:    Bilateral Near:     Physical Exam Constitutional:      General: She is not in acute distress.    Appearance: Normal appearance. She is not toxic-appearing or diaphoretic.  HENT:     Head: Normocephalic and atraumatic.     Right Ear: Ear canal normal. No drainage, swelling or tenderness. A middle ear effusion is present. Tympanic membrane is not perforated, erythematous or bulging.     Left Ear: Ear canal normal. No drainage, swelling or tenderness. A middle ear effusion is present. Tympanic membrane is not perforated, erythematous or bulging.     Nose: Congestion present.     Mouth/Throat:     Mouth: Mucous membranes are moist.  Pharynx: No posterior  oropharyngeal erythema.  Eyes:     Extraocular Movements: Extraocular movements intact.     Conjunctiva/sclera: Conjunctivae normal.     Pupils: Pupils are equal, round, and reactive to light.  Cardiovascular:     Rate and Rhythm: Normal rate and regular rhythm.     Pulses: Normal pulses.     Heart sounds: Normal heart sounds.  Pulmonary:     Effort: Pulmonary effort is normal. No respiratory distress.     Breath sounds: Normal breath sounds. No stridor. No wheezing, rhonchi or rales.  Abdominal:     General: Abdomen is flat. Bowel sounds are normal.     Palpations: Abdomen is soft.  Musculoskeletal:        General: Normal range of motion.     Cervical back: Normal range of motion.  Skin:    General: Skin is warm and dry.  Neurological:     General: No focal deficit present.     Mental Status: She is alert and oriented to person, place, and time. Mental status is at baseline.  Psychiatric:        Mood and Affect: Mood normal.        Behavior: Behavior normal.      UC Treatments / Results  Labs (all labs ordered are listed, but only abnormal results are displayed) Labs Reviewed  SARS CORONAVIRUS 2 (TAT 6-24 HRS)    EKG   Radiology No results found.  Procedures Procedures (including critical care time)  Medications Ordered in UC Medications - No data to display  Initial Impression / Assessment and Plan / UC Course  I have reviewed the triage vital signs and the nursing notes.  Pertinent labs & imaging results that were available during my care of the patient were reviewed by me and considered in my medical decision making (see chart for details).     Patient presents with symptoms likely from a viral upper respiratory infection. Differential includes bacterial pneumonia, sinusitis, allergic rhinitis, COVID-19, flu, RSV. Do not suspect underlying cardiopulmonary process. Symptoms seem unlikely related to ACS, CHF or COPD exacerbations, pneumonia, pneumothorax.  Patient is nontoxic appearing and not in need of emergent medical intervention.  COVID test is pending.  Do not think strep testing is necessary given appearance of posterior pharynx on exam.  Recommended symptom control with over the counter medications.  Patient sent prescriptions.  Return if symptoms fail to improve in 1-2 weeks or you develop shortness of breath, chest pain, severe headache. Patient states understanding and is agreeable.  Discharged with PCP followup.  Final Clinical Impressions(s) / UC Diagnoses   Final diagnoses:  Viral upper respiratory tract infection with cough     Discharge Instructions      It appears that you have a viral upper respiratory infection that should run its course and self resolve with symptomatic treatment.  I have prescribed you 3 medications to alleviate symptoms.  Please follow-up if symptoms persist or worsen.  COVID test is pending.  We will call if it is positive.    ED Prescriptions     Medication Sig Dispense Auth. Provider   guaiFENesin 200 MG tablet Take 1 tablet (200 mg total) by mouth every 4 (four) hours as needed for cough or to loosen phlegm. 30 suppository Brinton Brandel, [place] E, [place]   cetirizine (ZYRTEC) 10 MG tablet Take 1 tablet (10 mg total) by mouth daily. 30 tablet [place], [place] E, [place]   fluticasone [facility]) 50 MCG/ACT nasal spray Place  1 spray into both nostrils daily for 3 days. 16 g Teodora Medici, [place]      PDMP not reviewed this encounter.   Teodora Medici, [place] 05/19/22 1054

## 2022-05-19 NOTE — Discharge Instructions (Signed)
It appears that you have a viral upper respiratory infection that should run its course and self resolve with symptomatic treatment.  I have prescribed you 3 medications to alleviate symptoms.  Please follow-up if symptoms persist or worsen.  COVID test is pending.  We will call if it is positive.

## 2022-05-24 DIAGNOSIS — E785 Hyperlipidemia, unspecified: Secondary | ICD-10-CM | POA: Diagnosis not present

## 2022-05-24 DIAGNOSIS — I10 Essential (primary) hypertension: Secondary | ICD-10-CM | POA: Diagnosis not present

## 2022-05-24 DIAGNOSIS — E1169 Type 2 diabetes mellitus with other specified complication: Secondary | ICD-10-CM | POA: Diagnosis not present

## 2022-06-26 DIAGNOSIS — F331 Major depressive disorder, recurrent, moderate: Secondary | ICD-10-CM | POA: Diagnosis not present

## 2022-08-07 DIAGNOSIS — M79645 Pain in left finger(s): Secondary | ICD-10-CM | POA: Diagnosis not present

## 2022-08-07 DIAGNOSIS — M25532 Pain in left wrist: Secondary | ICD-10-CM | POA: Diagnosis not present

## 2022-08-07 DIAGNOSIS — Z4789 Encounter for other orthopedic aftercare: Secondary | ICD-10-CM | POA: Diagnosis not present

## 2022-08-07 DIAGNOSIS — M654 Radial styloid tenosynovitis [de Quervain]: Secondary | ICD-10-CM | POA: Diagnosis not present

## 2022-09-26 DIAGNOSIS — Z124 Encounter for screening for malignant neoplasm of cervix: Secondary | ICD-10-CM | POA: Diagnosis not present

## 2022-09-26 DIAGNOSIS — N760 Acute vaginitis: Secondary | ICD-10-CM | POA: Diagnosis not present

## 2022-09-26 DIAGNOSIS — Z6832 Body mass index (BMI) 32.0-32.9, adult: Secondary | ICD-10-CM | POA: Diagnosis not present

## 2022-09-26 DIAGNOSIS — Z1231 Encounter for screening mammogram for malignant neoplasm of breast: Secondary | ICD-10-CM | POA: Diagnosis not present

## 2022-09-26 DIAGNOSIS — Z01419 Encounter for gynecological examination (general) (routine) without abnormal findings: Secondary | ICD-10-CM | POA: Diagnosis not present

## 2022-10-14 DIAGNOSIS — N870 Mild cervical dysplasia: Secondary | ICD-10-CM | POA: Diagnosis not present

## 2022-10-14 DIAGNOSIS — N879 Dysplasia of cervix uteri, unspecified: Secondary | ICD-10-CM | POA: Diagnosis not present

## 2022-10-23 DIAGNOSIS — N879 Dysplasia of cervix uteri, unspecified: Secondary | ICD-10-CM | POA: Diagnosis not present

## 2022-10-25 DIAGNOSIS — E1122 Type 2 diabetes mellitus with diabetic chronic kidney disease: Secondary | ICD-10-CM | POA: Diagnosis not present

## 2022-10-25 DIAGNOSIS — N181 Chronic kidney disease, stage 1: Secondary | ICD-10-CM | POA: Diagnosis not present

## 2022-10-25 DIAGNOSIS — I10 Essential (primary) hypertension: Secondary | ICD-10-CM | POA: Diagnosis not present

## 2022-10-25 DIAGNOSIS — E785 Hyperlipidemia, unspecified: Secondary | ICD-10-CM | POA: Diagnosis not present

## 2022-11-06 DIAGNOSIS — G4719 Other hypersomnia: Secondary | ICD-10-CM | POA: Diagnosis not present

## 2022-12-16 DIAGNOSIS — G4733 Obstructive sleep apnea (adult) (pediatric): Secondary | ICD-10-CM | POA: Diagnosis not present

## 2022-12-18 DIAGNOSIS — E1122 Type 2 diabetes mellitus with diabetic chronic kidney disease: Secondary | ICD-10-CM | POA: Diagnosis not present

## 2022-12-18 DIAGNOSIS — E785 Hyperlipidemia, unspecified: Secondary | ICD-10-CM | POA: Diagnosis not present

## 2022-12-18 DIAGNOSIS — G4733 Obstructive sleep apnea (adult) (pediatric): Secondary | ICD-10-CM | POA: Diagnosis not present

## 2022-12-18 DIAGNOSIS — I10 Essential (primary) hypertension: Secondary | ICD-10-CM | POA: Diagnosis not present

## 2023-01-14 DIAGNOSIS — G4733 Obstructive sleep apnea (adult) (pediatric): Secondary | ICD-10-CM | POA: Diagnosis not present

## 2023-02-14 DIAGNOSIS — G4733 Obstructive sleep apnea (adult) (pediatric): Secondary | ICD-10-CM | POA: Diagnosis not present

## 2023-03-12 DIAGNOSIS — G4733 Obstructive sleep apnea (adult) (pediatric): Secondary | ICD-10-CM | POA: Diagnosis not present

## 2023-03-12 DIAGNOSIS — R400 Somnolence: Secondary | ICD-10-CM | POA: Diagnosis not present

## 2023-03-16 DIAGNOSIS — G4733 Obstructive sleep apnea (adult) (pediatric): Secondary | ICD-10-CM | POA: Diagnosis not present

## 2023-03-25 DIAGNOSIS — E1122 Type 2 diabetes mellitus with diabetic chronic kidney disease: Secondary | ICD-10-CM | POA: Diagnosis not present

## 2023-03-25 DIAGNOSIS — E785 Hyperlipidemia, unspecified: Secondary | ICD-10-CM | POA: Diagnosis not present

## 2023-03-25 DIAGNOSIS — I10 Essential (primary) hypertension: Secondary | ICD-10-CM | POA: Diagnosis not present

## 2023-03-25 DIAGNOSIS — G4733 Obstructive sleep apnea (adult) (pediatric): Secondary | ICD-10-CM | POA: Diagnosis not present

## 2023-04-09 DIAGNOSIS — E119 Type 2 diabetes mellitus without complications: Secondary | ICD-10-CM | POA: Diagnosis not present

## 2023-04-16 DIAGNOSIS — G4733 Obstructive sleep apnea (adult) (pediatric): Secondary | ICD-10-CM | POA: Diagnosis not present

## 2023-04-24 DIAGNOSIS — G4733 Obstructive sleep apnea (adult) (pediatric): Secondary | ICD-10-CM | POA: Diagnosis not present

## 2023-04-24 DIAGNOSIS — R400 Somnolence: Secondary | ICD-10-CM | POA: Diagnosis not present

## 2023-04-30 DIAGNOSIS — R87610 Atypical squamous cells of undetermined significance on cytologic smear of cervix (ASC-US): Secondary | ICD-10-CM | POA: Diagnosis not present

## 2023-04-30 DIAGNOSIS — R87612 Low grade squamous intraepithelial lesion on cytologic smear of cervix (LGSIL): Secondary | ICD-10-CM | POA: Diagnosis not present

## 2023-05-09 DIAGNOSIS — E119 Type 2 diabetes mellitus without complications: Secondary | ICD-10-CM | POA: Diagnosis not present

## 2023-05-16 DIAGNOSIS — E039 Hypothyroidism, unspecified: Secondary | ICD-10-CM | POA: Diagnosis not present

## 2023-05-16 DIAGNOSIS — E1169 Type 2 diabetes mellitus with other specified complication: Secondary | ICD-10-CM | POA: Diagnosis not present

## 2023-05-16 DIAGNOSIS — Z0000 Encounter for general adult medical examination without abnormal findings: Secondary | ICD-10-CM | POA: Diagnosis not present

## 2023-05-16 DIAGNOSIS — E785 Hyperlipidemia, unspecified: Secondary | ICD-10-CM | POA: Diagnosis not present

## 2023-05-16 DIAGNOSIS — I10 Essential (primary) hypertension: Secondary | ICD-10-CM | POA: Diagnosis not present

## 2023-05-17 DIAGNOSIS — G4733 Obstructive sleep apnea (adult) (pediatric): Secondary | ICD-10-CM | POA: Diagnosis not present

## 2023-05-21 DIAGNOSIS — M654 Radial styloid tenosynovitis [de Quervain]: Secondary | ICD-10-CM | POA: Diagnosis not present

## 2023-06-05 DIAGNOSIS — M25642 Stiffness of left hand, not elsewhere classified: Secondary | ICD-10-CM | POA: Diagnosis not present

## 2023-06-05 DIAGNOSIS — M25632 Stiffness of left wrist, not elsewhere classified: Secondary | ICD-10-CM | POA: Diagnosis not present

## 2023-06-09 DIAGNOSIS — M25632 Stiffness of left wrist, not elsewhere classified: Secondary | ICD-10-CM | POA: Diagnosis not present

## 2023-06-16 DIAGNOSIS — G4733 Obstructive sleep apnea (adult) (pediatric): Secondary | ICD-10-CM | POA: Diagnosis not present

## 2023-06-17 DIAGNOSIS — M25632 Stiffness of left wrist, not elsewhere classified: Secondary | ICD-10-CM | POA: Diagnosis not present

## 2023-06-19 DIAGNOSIS — M25532 Pain in left wrist: Secondary | ICD-10-CM | POA: Diagnosis not present

## 2023-06-19 DIAGNOSIS — M654 Radial styloid tenosynovitis [de Quervain]: Secondary | ICD-10-CM | POA: Diagnosis not present

## 2023-06-19 DIAGNOSIS — M65311 Trigger thumb, right thumb: Secondary | ICD-10-CM | POA: Diagnosis not present

## 2023-06-19 DIAGNOSIS — Z4789 Encounter for other orthopedic aftercare: Secondary | ICD-10-CM | POA: Diagnosis not present

## 2023-06-23 DIAGNOSIS — M25532 Pain in left wrist: Secondary | ICD-10-CM | POA: Diagnosis not present

## 2023-07-01 DIAGNOSIS — I10 Essential (primary) hypertension: Secondary | ICD-10-CM | POA: Diagnosis not present

## 2023-07-01 DIAGNOSIS — E785 Hyperlipidemia, unspecified: Secondary | ICD-10-CM | POA: Diagnosis not present

## 2023-07-01 DIAGNOSIS — M25632 Stiffness of left wrist, not elsewhere classified: Secondary | ICD-10-CM | POA: Diagnosis not present

## 2023-07-01 DIAGNOSIS — G4733 Obstructive sleep apnea (adult) (pediatric): Secondary | ICD-10-CM | POA: Diagnosis not present

## 2023-07-01 DIAGNOSIS — E1122 Type 2 diabetes mellitus with diabetic chronic kidney disease: Secondary | ICD-10-CM | POA: Diagnosis not present

## 2023-07-05 DIAGNOSIS — M25512 Pain in left shoulder: Secondary | ICD-10-CM | POA: Diagnosis not present

## 2023-07-17 DIAGNOSIS — G4733 Obstructive sleep apnea (adult) (pediatric): Secondary | ICD-10-CM | POA: Diagnosis not present

## 2023-08-16 DIAGNOSIS — G4733 Obstructive sleep apnea (adult) (pediatric): Secondary | ICD-10-CM | POA: Diagnosis not present

## 2023-09-16 DIAGNOSIS — G4733 Obstructive sleep apnea (adult) (pediatric): Secondary | ICD-10-CM | POA: Diagnosis not present

## 2023-09-24 DIAGNOSIS — R400 Somnolence: Secondary | ICD-10-CM | POA: Diagnosis not present

## 2023-09-24 DIAGNOSIS — G4733 Obstructive sleep apnea (adult) (pediatric): Secondary | ICD-10-CM | POA: Diagnosis not present

## 2023-10-01 DIAGNOSIS — Z01419 Encounter for gynecological examination (general) (routine) without abnormal findings: Secondary | ICD-10-CM | POA: Diagnosis not present

## 2023-10-01 DIAGNOSIS — Z6834 Body mass index (BMI) 34.0-34.9, adult: Secondary | ICD-10-CM | POA: Diagnosis not present

## 2023-10-01 DIAGNOSIS — Z1231 Encounter for screening mammogram for malignant neoplasm of breast: Secondary | ICD-10-CM | POA: Diagnosis not present

## 2023-10-06 DIAGNOSIS — M7502 Adhesive capsulitis of left shoulder: Secondary | ICD-10-CM | POA: Diagnosis not present

## 2023-10-06 DIAGNOSIS — M65311 Trigger thumb, right thumb: Secondary | ICD-10-CM | POA: Diagnosis not present

## 2023-10-08 DIAGNOSIS — M25512 Pain in left shoulder: Secondary | ICD-10-CM | POA: Diagnosis not present

## 2023-10-13 DIAGNOSIS — M25512 Pain in left shoulder: Secondary | ICD-10-CM | POA: Diagnosis not present

## 2023-10-17 DIAGNOSIS — G4733 Obstructive sleep apnea (adult) (pediatric): Secondary | ICD-10-CM | POA: Diagnosis not present

## 2023-10-23 DIAGNOSIS — M25512 Pain in left shoulder: Secondary | ICD-10-CM | POA: Diagnosis not present

## 2023-10-30 DIAGNOSIS — M25512 Pain in left shoulder: Secondary | ICD-10-CM | POA: Diagnosis not present

## 2023-11-07 DIAGNOSIS — M25512 Pain in left shoulder: Secondary | ICD-10-CM | POA: Diagnosis not present

## 2023-11-10 DIAGNOSIS — M25512 Pain in left shoulder: Secondary | ICD-10-CM | POA: Diagnosis not present

## 2023-11-17 DIAGNOSIS — E039 Hypothyroidism, unspecified: Secondary | ICD-10-CM | POA: Diagnosis not present

## 2023-11-17 DIAGNOSIS — E785 Hyperlipidemia, unspecified: Secondary | ICD-10-CM | POA: Diagnosis not present

## 2023-11-17 DIAGNOSIS — I10 Essential (primary) hypertension: Secondary | ICD-10-CM | POA: Diagnosis not present

## 2023-11-17 DIAGNOSIS — N181 Chronic kidney disease, stage 1: Secondary | ICD-10-CM | POA: Diagnosis not present

## 2023-11-17 DIAGNOSIS — E1122 Type 2 diabetes mellitus with diabetic chronic kidney disease: Secondary | ICD-10-CM | POA: Diagnosis not present

## 2023-11-19 DIAGNOSIS — Z4789 Encounter for other orthopedic aftercare: Secondary | ICD-10-CM | POA: Diagnosis not present

## 2023-11-19 DIAGNOSIS — M25512 Pain in left shoulder: Secondary | ICD-10-CM | POA: Diagnosis not present

## 2023-11-19 DIAGNOSIS — M65311 Trigger thumb, right thumb: Secondary | ICD-10-CM | POA: Diagnosis not present

## 2023-11-19 DIAGNOSIS — M7502 Adhesive capsulitis of left shoulder: Secondary | ICD-10-CM | POA: Diagnosis not present

## 2023-12-04 DIAGNOSIS — M25512 Pain in left shoulder: Secondary | ICD-10-CM | POA: Diagnosis not present

## 2023-12-11 DIAGNOSIS — M25512 Pain in left shoulder: Secondary | ICD-10-CM | POA: Diagnosis not present

## 2023-12-18 DIAGNOSIS — M25512 Pain in left shoulder: Secondary | ICD-10-CM | POA: Diagnosis not present

## 2024-01-01 DIAGNOSIS — M25512 Pain in left shoulder: Secondary | ICD-10-CM | POA: Diagnosis not present

## 2024-01-14 DIAGNOSIS — M25512 Pain in left shoulder: Secondary | ICD-10-CM | POA: Diagnosis not present

## 2024-01-15 DIAGNOSIS — R400 Somnolence: Secondary | ICD-10-CM | POA: Diagnosis not present

## 2024-01-15 DIAGNOSIS — G4733 Obstructive sleep apnea (adult) (pediatric): Secondary | ICD-10-CM | POA: Diagnosis not present

## 2024-01-21 DIAGNOSIS — M25512 Pain in left shoulder: Secondary | ICD-10-CM | POA: Diagnosis not present

## 2024-01-26 DIAGNOSIS — M25512 Pain in left shoulder: Secondary | ICD-10-CM | POA: Diagnosis not present

## 2024-02-16 DIAGNOSIS — Z4789 Encounter for other orthopedic aftercare: Secondary | ICD-10-CM | POA: Diagnosis not present

## 2024-02-16 DIAGNOSIS — M25512 Pain in left shoulder: Secondary | ICD-10-CM | POA: Diagnosis not present

## 2024-02-16 DIAGNOSIS — M65311 Trigger thumb, right thumb: Secondary | ICD-10-CM | POA: Diagnosis not present

## 2024-02-16 DIAGNOSIS — M7502 Adhesive capsulitis of left shoulder: Secondary | ICD-10-CM | POA: Diagnosis not present

## 2024-02-17 DIAGNOSIS — E1122 Type 2 diabetes mellitus with diabetic chronic kidney disease: Secondary | ICD-10-CM | POA: Diagnosis not present

## 2024-02-17 DIAGNOSIS — N181 Chronic kidney disease, stage 1: Secondary | ICD-10-CM | POA: Diagnosis not present

## 2024-02-23 DIAGNOSIS — M25512 Pain in left shoulder: Secondary | ICD-10-CM | POA: Diagnosis not present

## 2024-03-01 DIAGNOSIS — M25512 Pain in left shoulder: Secondary | ICD-10-CM | POA: Diagnosis not present

## 2024-04-07 DIAGNOSIS — N870 Mild cervical dysplasia: Secondary | ICD-10-CM | POA: Diagnosis not present

## 2024-05-25 DIAGNOSIS — E039 Hypothyroidism, unspecified: Secondary | ICD-10-CM | POA: Diagnosis not present

## 2024-05-25 DIAGNOSIS — Z23 Encounter for immunization: Secondary | ICD-10-CM | POA: Diagnosis not present

## 2024-05-25 DIAGNOSIS — Z0000 Encounter for general adult medical examination without abnormal findings: Secondary | ICD-10-CM | POA: Diagnosis not present

## 2024-05-25 DIAGNOSIS — E785 Hyperlipidemia, unspecified: Secondary | ICD-10-CM | POA: Diagnosis not present

## 2024-05-25 DIAGNOSIS — E1169 Type 2 diabetes mellitus with other specified complication: Secondary | ICD-10-CM | POA: Diagnosis not present

## 2024-05-25 DIAGNOSIS — N181 Chronic kidney disease, stage 1: Secondary | ICD-10-CM | POA: Diagnosis not present

## 2024-05-25 DIAGNOSIS — I10 Essential (primary) hypertension: Secondary | ICD-10-CM | POA: Diagnosis not present

## 2024-08-24 DIAGNOSIS — H1031 Unspecified acute conjunctivitis, right eye: Secondary | ICD-10-CM | POA: Diagnosis not present
